# Patient Record
Sex: Female | Born: 1945 | Race: White | Hispanic: No | Marital: Married | Smoking: Current every day smoker
Health system: Southern US, Community
[De-identification: ages and names within clinical notes are randomized; demographics above are authoritative.]

## PROBLEM LIST (undated history)

## (undated) DIAGNOSIS — Z1371 Encounter for nonprocreative screening for genetic disease carrier status: Secondary | ICD-10-CM

## (undated) DIAGNOSIS — C801 Malignant (primary) neoplasm, unspecified: Secondary | ICD-10-CM

## (undated) DIAGNOSIS — E785 Hyperlipidemia, unspecified: Secondary | ICD-10-CM

## (undated) DIAGNOSIS — M810 Age-related osteoporosis without current pathological fracture: Secondary | ICD-10-CM

## (undated) DIAGNOSIS — Z1211 Encounter for screening for malignant neoplasm of colon: Secondary | ICD-10-CM

## (undated) DIAGNOSIS — H209 Unspecified iridocyclitis: Secondary | ICD-10-CM

## (undated) DIAGNOSIS — R911 Solitary pulmonary nodule: Secondary | ICD-10-CM

## (undated) DIAGNOSIS — H3320 Serous retinal detachment, unspecified eye: Secondary | ICD-10-CM

## (undated) DIAGNOSIS — J8410 Pulmonary fibrosis, unspecified: Secondary | ICD-10-CM

## (undated) DIAGNOSIS — Z860101 Personal history of adenomatous and serrated colon polyps: Secondary | ICD-10-CM

## (undated) DIAGNOSIS — I77810 Thoracic aortic ectasia: Secondary | ICD-10-CM

## (undated) DIAGNOSIS — Z853 Personal history of malignant neoplasm of breast: Secondary | ICD-10-CM

## (undated) DIAGNOSIS — Z87891 Personal history of nicotine dependence: Secondary | ICD-10-CM

## (undated) DIAGNOSIS — Z720 Tobacco use: Secondary | ICD-10-CM

## (undated) DIAGNOSIS — T7840XA Allergy, unspecified, initial encounter: Secondary | ICD-10-CM

## (undated) HISTORY — DX: Tobacco use: Z72.0

## (undated) HISTORY — DX: Personal history of malignant neoplasm of breast: Z85.3

## (undated) HISTORY — DX: Allergy, unspecified, initial encounter: T78.40XA

## (undated) HISTORY — DX: Unspecified iridocyclitis: H20.9

## (undated) HISTORY — DX: Encounter for screening for malignant neoplasm of colon: Z12.11

## (undated) HISTORY — DX: Personal history of nicotine dependence: Z87.891

## (undated) HISTORY — PX: SKIN CANCER EXCISION: SHX779

## (undated) HISTORY — DX: Serous retinal detachment, unspecified eye: H33.20

## (undated) HISTORY — DX: Encounter for nonprocreative screening for genetic disease carrier status: Z13.71

## (undated) HISTORY — PX: BREAST SURGERY: SHX581

## (undated) HISTORY — PX: CATARACT EXTRACTION W/ INTRAOCULAR LENS IMPLANT: SHX1309

## (undated) HISTORY — PX: EYE SURGERY: SHX253

## (undated) HISTORY — DX: Malignant (primary) neoplasm, unspecified: C80.1

---
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
## 1985-03-30 DIAGNOSIS — Z853 Personal history of malignant neoplasm of breast: Secondary | ICD-10-CM

## 1985-03-30 DIAGNOSIS — C801 Malignant (primary) neoplasm, unspecified: Secondary | ICD-10-CM

## 1985-03-30 HISTORY — DX: Personal history of malignant neoplasm of breast: Z85.3

## 1985-03-30 HISTORY — PX: MASTECTOMY: SHX3

## 1985-03-30 HISTORY — DX: Malignant (primary) neoplasm, unspecified: C80.1

## 1998-04-19 ENCOUNTER — Other Ambulatory Visit: Admission: RE | Admit: 1998-04-19 | Discharge: 1998-04-19 | Payer: Self-pay | Admitting: *Deleted

## 1999-06-06 ENCOUNTER — Other Ambulatory Visit: Admission: RE | Admit: 1999-06-06 | Discharge: 1999-06-06 | Payer: Self-pay | Admitting: *Deleted

## 2000-06-10 ENCOUNTER — Other Ambulatory Visit: Admission: RE | Admit: 2000-06-10 | Discharge: 2000-06-10 | Payer: Self-pay | Admitting: *Deleted

## 2001-06-10 ENCOUNTER — Other Ambulatory Visit: Admission: RE | Admit: 2001-06-10 | Discharge: 2001-06-10 | Payer: Self-pay | Admitting: *Deleted

## 2002-06-15 ENCOUNTER — Other Ambulatory Visit: Admission: RE | Admit: 2002-06-15 | Discharge: 2002-06-15 | Payer: Self-pay | Admitting: *Deleted

## 2003-03-31 DIAGNOSIS — H209 Unspecified iridocyclitis: Secondary | ICD-10-CM

## 2003-03-31 HISTORY — DX: Unspecified iridocyclitis: H20.9

## 2003-07-17 ENCOUNTER — Other Ambulatory Visit: Admission: RE | Admit: 2003-07-17 | Discharge: 2003-07-17 | Payer: Self-pay | Admitting: *Deleted

## 2004-07-03 ENCOUNTER — Ambulatory Visit: Payer: Self-pay | Admitting: General Surgery

## 2005-06-26 ENCOUNTER — Ambulatory Visit: Payer: Self-pay | Admitting: Unknown Physician Specialty

## 2005-08-17 ENCOUNTER — Ambulatory Visit: Payer: Self-pay | Admitting: General Surgery

## 2006-05-03 ENCOUNTER — Ambulatory Visit: Payer: Self-pay | Admitting: Ophthalmology

## 2006-08-18 ENCOUNTER — Ambulatory Visit: Payer: Self-pay | Admitting: General Surgery

## 2007-08-30 ENCOUNTER — Ambulatory Visit: Payer: Self-pay | Admitting: General Surgery

## 2008-03-30 HISTORY — PX: COLONOSCOPY: SHX174

## 2008-05-03 ENCOUNTER — Ambulatory Visit: Payer: Self-pay | Admitting: Internal Medicine

## 2008-09-06 ENCOUNTER — Ambulatory Visit: Payer: Self-pay | Admitting: General Surgery

## 2009-09-12 ENCOUNTER — Ambulatory Visit: Payer: Self-pay | Admitting: General Surgery

## 2009-12-04 ENCOUNTER — Ambulatory Visit: Payer: Self-pay | Admitting: Unknown Physician Specialty

## 2010-09-24 ENCOUNTER — Ambulatory Visit: Payer: Self-pay | Admitting: General Surgery

## 2011-10-07 ENCOUNTER — Ambulatory Visit: Payer: Self-pay | Admitting: General Surgery

## 2012-08-09 ENCOUNTER — Encounter: Payer: Self-pay | Admitting: *Deleted

## 2012-08-09 DIAGNOSIS — Z853 Personal history of malignant neoplasm of breast: Secondary | ICD-10-CM | POA: Insufficient documentation

## 2012-10-10 ENCOUNTER — Ambulatory Visit: Payer: Self-pay | Admitting: General Surgery

## 2012-10-12 ENCOUNTER — Encounter: Payer: Self-pay | Admitting: General Surgery

## 2012-10-25 ENCOUNTER — Encounter: Payer: Self-pay | Admitting: *Deleted

## 2012-10-27 ENCOUNTER — Ambulatory Visit (INDEPENDENT_AMBULATORY_CARE_PROVIDER_SITE_OTHER): Payer: Medicare Other | Admitting: General Surgery

## 2012-10-27 ENCOUNTER — Encounter: Payer: Self-pay | Admitting: General Surgery

## 2012-10-27 VITALS — BP 112/70 | HR 80 | Resp 12 | Ht 67.0 in | Wt 136.0 lb

## 2012-10-27 DIAGNOSIS — Z853 Personal history of malignant neoplasm of breast: Secondary | ICD-10-CM

## 2012-10-27 NOTE — Patient Instructions (Addendum)
Patient to return in one year left breat mammogram.

## 2012-10-27 NOTE — Progress Notes (Signed)
Patient ID: Marilyn Coleman, female   DOB: December 08, 1945, 67 y.o.   MRN: [id number]  Chief Complaint  Patient presents with  . Follow-up    mammogram    HPI Marilyn Coleman is a 67 y.o. female who presents for a breast evaluation. The most recent mammogram was done on 10/10/12 cat 2.  Patient does perform regular self breast checks and gets regular mammograms done.    HPI  Past Medical History  Diagnosis Date  . Allergy   . Personal history of tobacco use, presenting hazards to health   . Personal history of malignant neoplasm of breast 1987    right mastectomy  . Iritis 2005  . Special screening for malignant neoplasms, colon   . 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 Cancer 1987    right breast. mastectomy    Past Surgical History  Procedure Laterality Date  . Colonoscopy  2010    Dr. Mechele Collin  . Skin cancer excision  [id number]    face x 4  . Mastectomy Right 1987  . Tonsillectomy  1962  . Breast surgery Right     mastectomy    Family History  Problem Relation Age of Onset  . Cancer Other     breast cancer FH    Social History History  Substance Use Topics  . Smoking status: Current Every Day Smoker -- 2.00 packs/day for 35 years    Types: Cigarettes  . Smokeless tobacco: Never Used  . Alcohol Use: Yes     Comment: ocassionally    Allergies  Allergen Reactions  . Ace Inhibitors Other (See Comments)    Reaction not listed by pt.  . Acrylic Polymer (Carbomer) Itching    Allergy is to acrylic acid  . Eggs Or Egg-Derived Products Other (See Comments)    Joint pain    Current Outpatient Prescriptions  Medication Sig Dispense Refill  . ASTRAGALUS PO Take by mouth.      . loratadine (CLARITIN) 10 MG tablet Take 10 mg by mouth daily.       No current facility-administered medications for this visit.    Review of Systems Review of Systems  Constitutional: Negative.   Respiratory: Negative.   Cardiovascular: Negative.     Blood pressure 112/70, pulse 80, resp. rate 12,  height 5\' 7"  (1.702 m), weight 136 lb (61.689 kg).  Physical Exam Physical Exam  Constitutional: She is oriented to person, place, and time. She appears well-developed and well-nourished.  Eyes: Pupils are equal, round, and reactive to light. No scleral icterus.  Neck: Neck supple. No mass and no thyromegaly present.  Cardiovascular: Normal rate, regular rhythm and normal heart sounds.   Pulses:      Dorsalis pedis pulses are 2+ on the right side, and 2+ on the left side.       Posterior tibial pulses are 2+ on the right side, and 2+ on the left side.  No edema.  Pulmonary/Chest: Breath sounds normal. Left breast exhibits no inverted nipple, no mass, no nipple discharge, no skin change and no tenderness.  Right mastectomy well healed.No sign of local recurrence.  Abdominal: Soft. Bowel sounds are normal. There is no hepatosplenomegaly. There is no tenderness. No hernia.  Lymphadenopathy:    She has no cervical adenopathy.    She has no axillary adenopathy.  Neurological: She is alert and oriented to person, place, and time.  Skin: Skin is warm and dry.    Data Reviewed Mammogram reviewed  Assessment    Stable exam     Plan    Patient to return for an left breast mammogram in one year.       SANKAR,SEEPLAPUTHUR G 10/28/2012, 7:02 AM

## 2012-10-28 ENCOUNTER — Encounter: Payer: Self-pay | Admitting: General Surgery

## 2013-11-02 ENCOUNTER — Encounter: Payer: Self-pay | Admitting: General Surgery

## 2013-11-02 ENCOUNTER — Ambulatory Visit: Payer: Medicare Other | Admitting: General Surgery

## 2013-11-09 ENCOUNTER — Ambulatory Visit (INDEPENDENT_AMBULATORY_CARE_PROVIDER_SITE_OTHER): Payer: Self-pay | Admitting: General Surgery

## 2013-11-09 ENCOUNTER — Encounter: Payer: Self-pay | Admitting: General Surgery

## 2013-11-09 VITALS — BP 110/70 | HR 80 | Resp 14 | Ht 67.0 in | Wt 136.0 lb

## 2013-11-09 DIAGNOSIS — Z853 Personal history of malignant neoplasm of breast: Secondary | ICD-10-CM

## 2013-11-09 NOTE — Progress Notes (Signed)
Patient ID: Marilyn Coleman, female   DOB: 1945/05/29, 68 y.o.   MRN: [id number]  Chief Complaint  Patient presents with  . Follow-up    mammogram    HPI Marilyn Coleman is a 68 y.o. female.  who presents for her follow up breast evaluation. The most recent mammogram was done on 10-31-13 . Patient does perform regular self breast checks and gets regular mammograms done. No new breast complaints. No new health issues  HPI  Past Medical History  Diagnosis Date  . Allergy   . Personal history of tobacco use, presenting hazards to health   . Personal history of malignant neoplasm of breast 1987    right mastectomy  . Iritis 2005  . Special screening for malignant neoplasms, colon   . 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 Cancer 1987    right breast. mastectomy    Past Surgical History  Procedure Laterality Date  . Colonoscopy  2010    Dr. Vira Agar  . Skin cancer excision  [id number]    face x 4  . Mastectomy Right 1987  . Tonsillectomy  1962  . Breast surgery Right     mastectomy    Family History  Problem Relation Age of Onset  . Cancer Other     breast cancer FH    Social History History  Substance Use Topics  . Smoking status: Current Every Day Smoker -- 2.00 packs/day for 35 years    Types: Cigarettes  . Smokeless tobacco: Never Used  . Alcohol Use: Yes     Comment: ocassionally    Allergies  Allergen Reactions  . Ace Inhibitors Other (See Comments)    Reaction not listed by pt.  . Acrylic Polymer [Carbomer] Itching    Allergy is to acrylic acid  . Eggs Or Egg-Derived Products Other (See Comments)    Joint pain    Current Outpatient Prescriptions  Medication Sig Dispense Refill  . ASTRAGALUS PO Take by mouth.      . loratadine (CLARITIN) 10 MG tablet Take 10 mg by mouth daily.       No current facility-administered medications for this visit.    Review of Systems Review of Systems  Constitutional: Negative.   Respiratory: Negative.   Cardiovascular: Negative.      Blood pressure 110/70, pulse 80, resp. rate 14, height 5\' 7"  (1.702 m), weight 136 lb (61.689 kg).  Physical Exam Physical Exam  Constitutional: She is oriented to person, place, and time. She appears well-developed and well-nourished.  Eyes: Conjunctivae are normal. No scleral icterus.  Neck: Neck supple. No thyromegaly present.  Cardiovascular: Normal rate, regular rhythm and normal heart sounds.   No murmur heard. Pulmonary/Chest: Effort normal and breath sounds normal. Left breast exhibits no inverted nipple, no mass, no nipple discharge, no skin change and no tenderness.  Right mastectomy site well healed, clean, no signs of reoccurrence.   Abdominal: Soft. Normal appearance and bowel sounds are normal. There is no hepatomegaly. There is no tenderness.  Lymphadenopathy:    She has no cervical adenopathy.    She has no axillary adenopathy.  Neurological: She is alert and oriented to person, place, and time.  Skin: Skin is warm and dry.    Data Reviewed Left mammogram-stable.  Assessment    History of right breast cancer, MRM in 1987. Doing well.     Plan    1 yr f/u        [facility] G 11/09/2013, 6:59  PM

## 2013-11-09 NOTE — Patient Instructions (Signed)
Patient to return in 1 year with left screening mammogram. Continue self breast exams. Call office for any new breast issues or concerns.

## 2014-01-29 ENCOUNTER — Encounter: Payer: Self-pay | Admitting: General Surgery

## 2014-04-24 DIAGNOSIS — M810 Age-related osteoporosis without current pathological fracture: Secondary | ICD-10-CM | POA: Insufficient documentation

## 2014-04-24 DIAGNOSIS — E785 Hyperlipidemia, unspecified: Secondary | ICD-10-CM | POA: Insufficient documentation

## 2014-04-24 DIAGNOSIS — H209 Unspecified iridocyclitis: Secondary | ICD-10-CM | POA: Insufficient documentation

## 2014-08-08 ENCOUNTER — Other Ambulatory Visit: Payer: Self-pay | Admitting: Family Medicine

## 2014-08-08 ENCOUNTER — Encounter: Payer: Self-pay | Admitting: Family Medicine

## 2014-08-08 DIAGNOSIS — Z720 Tobacco use: Secondary | ICD-10-CM | POA: Insufficient documentation

## 2014-08-08 HISTORY — DX: Tobacco use: Z72.0

## 2014-08-10 ENCOUNTER — Inpatient Hospital Stay: Payer: Medicare Other | Attending: Family Medicine | Admitting: Family Medicine

## 2014-08-10 ENCOUNTER — Ambulatory Visit
Admission: RE | Admit: 2014-08-10 | Discharge: 2014-08-10 | Disposition: A | Discharge status: Home / Self Care | Payer: Medicare Other | Source: Ambulatory Visit | Attending: Family Medicine | Admitting: Family Medicine

## 2014-08-10 ENCOUNTER — Encounter: Payer: Self-pay | Admitting: Family Medicine

## 2014-08-10 ENCOUNTER — Encounter (INDEPENDENT_AMBULATORY_CARE_PROVIDER_SITE_OTHER): Payer: Self-pay

## 2014-08-10 DIAGNOSIS — Z122 Encounter for screening for malignant neoplasm of respiratory organs: Secondary | ICD-10-CM

## 2014-08-10 DIAGNOSIS — F17200 Nicotine dependence, unspecified, uncomplicated: Secondary | ICD-10-CM | POA: Diagnosis present

## 2014-08-10 DIAGNOSIS — F17210 Nicotine dependence, cigarettes, uncomplicated: Secondary | ICD-10-CM

## 2014-08-10 DIAGNOSIS — I700 Atherosclerosis of aorta: Secondary | ICD-10-CM | POA: Insufficient documentation

## 2014-08-10 DIAGNOSIS — Z720 Tobacco use: Secondary | ICD-10-CM

## 2014-08-10 NOTE — Progress Notes (Signed)
In accordance with CMS guidelines, patient has meet eligibility criteria including age, absence of signs or symptoms of lung cancer, the specific calculation of cigarette smoking pack-years was 60 years and is a current smoker.   A shared decision-making session was conducted prior to the performance of CT scan. This includes one or more decision aids, includes benefits and harms of screening, follow-up diagnostic testing, over-diagnosis, false positive rate, and total radiation exposure.  Counseling on the importance of adherence to annual lung cancer LDCT screening, impact of co-morbidities, and ability or willingness to undergo diagnosis and treatment is imperative for compliance of the program.  Counseling on the importance of continued smoking cessation for former smokers; the importance of smoking cessation for current smokers and information about tobacco cessation interventions have been given to patient including the [place] at [facility], 1800 quit [place], as well as Cancer Center specific smoking cessation programs.  Written order for lung cancer screening with LDCT has been given to the patient and any and all questions have been answered to the best of my abilities.   Yearly follow up will be scheduled by Shawn Perkins, Thoracic Navigator.   

## 2014-08-13 ENCOUNTER — Telehealth: Payer: Self-pay | Admitting: *Deleted

## 2014-08-13 NOTE — Telephone Encounter (Signed)
Notified patient of LDCT lung cancer screening results of [facility] [place] 1. Finding with recommendation for 12 month follow up imaging. Also notified of incidental finding of evidence of calcified aortic atherosclerotic changes and prior granulomatous infection. Patient is encouraged to discuss further with her primary care provider and a message will be sent to Dr. Caryl Comes.

## 2014-09-26 ENCOUNTER — Other Ambulatory Visit: Payer: Self-pay

## 2014-09-26 DIAGNOSIS — Z853 Personal history of malignant neoplasm of breast: Secondary | ICD-10-CM

## 2014-10-29 HISTORY — PX: RETINAL DETACHMENT SURGERY: SHX105

## 2014-11-14 ENCOUNTER — Ambulatory Visit: Payer: Medicare Other | Admitting: General Surgery

## 2014-12-11 ENCOUNTER — Ambulatory Visit (INDEPENDENT_AMBULATORY_CARE_PROVIDER_SITE_OTHER): Payer: Medicare Other | Admitting: General Surgery

## 2014-12-11 ENCOUNTER — Encounter: Payer: Self-pay | Admitting: General Surgery

## 2014-12-11 VITALS — BP 116/68 | HR 72 | Resp 14 | Ht 66.0 in | Wt 139.0 lb

## 2014-12-11 DIAGNOSIS — Z853 Personal history of malignant neoplasm of breast: Secondary | ICD-10-CM

## 2014-12-11 NOTE — Patient Instructions (Signed)
Call if any concerns or questions.

## 2014-12-11 NOTE — Progress Notes (Signed)
Patient ID: Marilyn Coleman, female   DOB: Mar 06, 1946, 69 y.o.   MRN: [id number]  Chief Complaint  Patient presents with  . Follow-up    Mammogram     HPI Marilyn Coleman is a 69 y.o. female who presents for a breast evaluation. The most recent mammogram was done on 12/04/14.  Patient does perform regular self breast checks and gets regular mammograms done. Patient denies any breast problems to her left breast. Patient had retinal detachment surgery done 10/2014.    HPI  Past Medical History  Diagnosis Date  . Allergy   . Personal history of tobacco use, presenting hazards to health   . Personal history of malignant neoplasm of breast 1987    right mastectomy  . Iritis 2005  . Special screening for malignant neoplasms, colon   . 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 Cancer 1987    right breast. mastectomy  . Tobacco abuse 08/08/2014    Past Surgical History  Procedure Laterality Date  . Colonoscopy  2010    Dr. Vira Agar  . Skin cancer excision  [id number]    face x 4  . Mastectomy Right 1987  . Tonsillectomy  1962  . Breast surgery Right     mastectomy  . Retinal detachment surgery  10/2014    Family History  Problem Relation Age of Onset  . Cancer Other     breast cancer FH    Social History Social History  Substance Use Topics  . Smoking status: Current Every Day Smoker -- 2.00 packs/day for 30 years    Types: Cigarettes  . Smokeless tobacco: Never Used  . Alcohol Use: 0.0 oz/week    0 Standard drinks or equivalent per week     Comment: ocassionally    Allergies  Allergen Reactions  . Acrylic Polymer [Carbomer] Itching    Allergy is to acrylic acid  . Eggs Or Egg-Derived Products Other (See Comments)    Joint pain    Current Outpatient Prescriptions  Medication Sig Dispense Refill  . Alpha-Lipoic Acid 50 MG CAPS     . ASTRAGALUS PO Take by mouth.    . Calcium Citrate 250 MG TABS     . CHROMIUM PO     . Co-Enzyme Q-10 30 MG CAPS     . loratadine (CLARITIN) 10 MG  tablet Take 10 mg by mouth.    . Multiple Vitamin (MULTI-VITAMINS) TABS Take by mouth.    . selenium 50 MCG TABS tablet      No current facility-administered medications for this visit.    Review of Systems Review of Systems  Constitutional: Negative.   Respiratory: Negative.   Cardiovascular: Negative.     Blood pressure 116/68, pulse 72, resp. rate 14, height 5\' 6"  (1.676 m), weight 139 lb (63.05 kg).  Physical Exam Physical Exam  Constitutional: She is oriented to person, place, and time. She appears well-developed and well-nourished.  HENT:  Mouth/Throat: Oropharynx is clear and moist.  Eyes: Conjunctivae are normal. No scleral icterus.  Neck: Neck supple.  Cardiovascular: Normal rate, regular rhythm and normal heart sounds.   Pulmonary/Chest: Effort normal and breath sounds normal. Left breast exhibits no inverted nipple, no mass, no nipple discharge, no skin change and no tenderness.  Right mastectomy site remains well healed. No evidence of local recurrence  Lymphadenopathy:    She has no cervical adenopathy.    She has no axillary adenopathy.  Neurological: She is alert and oriented to  person, place, and time.  Skin: Skin is warm and dry.  Psychiatric: Her behavior is normal.    Data Reviewed Mammogram reviewed   Assessment Stable exam. History of right breast CA-1987   Plan    Return in one year with left breast mammogram.      PCP: Luther Redo G 12/12/2014, 11:04 AM

## 2014-12-12 ENCOUNTER — Encounter: Payer: Self-pay | Admitting: General Surgery

## 2015-02-26 ENCOUNTER — Encounter: Payer: Self-pay | Admitting: Obstetrics and Gynecology

## 2015-02-26 ENCOUNTER — Ambulatory Visit (INDEPENDENT_AMBULATORY_CARE_PROVIDER_SITE_OTHER): Payer: Medicare Other | Admitting: Obstetrics and Gynecology

## 2015-02-26 VITALS — BP 124/79 | HR 118 | Ht 67.0 in | Wt 141.9 lb

## 2015-02-26 DIAGNOSIS — C50919 Malignant neoplasm of unspecified site of unspecified female breast: Secondary | ICD-10-CM | POA: Insufficient documentation

## 2015-02-26 DIAGNOSIS — M810 Age-related osteoporosis without current pathological fracture: Secondary | ICD-10-CM | POA: Diagnosis not present

## 2015-02-26 DIAGNOSIS — J309 Allergic rhinitis, unspecified: Secondary | ICD-10-CM | POA: Insufficient documentation

## 2015-02-26 DIAGNOSIS — N952 Postmenopausal atrophic vaginitis: Secondary | ICD-10-CM | POA: Diagnosis not present

## 2015-02-26 DIAGNOSIS — Z01419 Encounter for gynecological examination (general) (routine) without abnormal findings: Secondary | ICD-10-CM | POA: Diagnosis not present

## 2015-02-26 DIAGNOSIS — Z9189 Other specified personal risk factors, not elsewhere classified: Secondary | ICD-10-CM

## 2015-02-26 NOTE — Progress Notes (Signed)
Patient ID: Marilyn Coleman, female   DOB: [birth year], 69 y.o.   MRN: [id number] ANNUAL PREVENTATIVE CARE GYN  ENCOUNTER NOTE  Subjective:       Marilyn Coleman is a 69 y.o. G2P2 female here for a routine annual gynecologic exam.  Current complaints: 1.  Medicare breast and pelvic  2.  History of breast cancer, followed by Dr. Jamal Collin 3.  History of osteoporosis. 4.  Vaginal atrophy   Gynecologic History No LMP recorded. Patient is postmenopausal. Contraception: post menopausal status Last Pap: 2015. Results were: normal Last mammogram: 07/2014. Results were: normal History of osteoporosis on DEXA scan in 2012  Obstetric History OB History  Gravida Para Term Preterm AB SAB TAB Ectopic Multiple Living  2 2        2     # Outcome Date GA Lbr Len/2nd Weight Sex Delivery Anes PTL Lv  2 Para           1 Para             Obstetric Comments  Age with first menstruation-11  Age with first pregnancy-24  LMP-1988    Past Medical History  Diagnosis Date  . Allergy   . Personal history of tobacco use, presenting hazards to health   . Personal history of malignant neoplasm of breast 1987    right mastectomy  . Iritis 2005  . Special screening for malignant neoplasms, colon   . Cancer ([place]) 2012    face x 4  . Cancer [facility]) 1987    right breast. mastectomy  . Tobacco abuse 08/08/2014  . Detached retina     Past Surgical History  Procedure Laterality Date  . Colonoscopy  2010    Dr. Vira Agar  . Skin cancer excision  [id number]    face x 4  . Mastectomy Right 1987  . Tonsillectomy  1962  . Breast surgery Right     mastectomy  . Retinal detachment surgery  10/2014    Current Outpatient Prescriptions on File Prior to Visit  Medication Sig Dispense Refill  . Alpha-Lipoic Acid 50 MG CAPS     . ASTRAGALUS PO Take by mouth.    . Calcium Citrate 250 MG TABS     . CHROMIUM PO     . Co-Enzyme Q-10 30 MG CAPS     . loratadine (CLARITIN) 10 MG tablet Take 10 mg by mouth.    . Multiple  Vitamin (MULTI-VITAMINS) TABS Take by mouth.    . selenium 50 MCG TABS tablet      No current facility-administered medications on file prior to visit.    Allergies  Allergen Reactions  . Acrylic Polymer [Carbomer] Itching    Allergy is to acrylic acid  . Eggs Or Egg-Derived Products Other (See Comments)    Joint pain    Social History   Social History  . Marital Status: Married    Spouse Name: N/A  . Number of Children: N/A  . Years of Education: N/A   Occupational History  . Not on file.   Social History Main Topics  . Smoking status: Current Every Day Smoker -- 2.00 packs/day for 30 years    Types: Cigarettes  . Smokeless tobacco: Never Used  . Alcohol Use: 0.0 oz/week    0 Standard drinks or equivalent per week     Comment: ocassionally  . Drug Use: No  . Sexual Activity: Yes    Birth Control/ Protection: Post-menopausal   Other Topics Concern  .  Not on file   Social History Narrative    Family History  Problem Relation Age of Onset  . Cancer Other     breast cancer FH  . Breast cancer Mother   . Breast cancer Paternal Grandmother   . Diabetes Neg Hx   . Heart disease Neg Hx   . Colon cancer Neg Hx   . Ovarian cancer Neg Hx     The following portions of the patient's history were reviewed and updated as appropriate: allergies, current medications, past family history, past medical history, past social history, past surgical history and problem list.  Review of Systems ROS Review of Systems - General ROS: negative for - chills, fatigue, fever, hot flashes, night sweats, weight gain or weight loss Psychological ROS: negative for - anxiety, decreased libido, depression, mood swings, physical abuse or sexual abuse Ophthalmic ROS: negative for - blurry vision, eye pain or loss of vision ENT ROS: negative for - headaches, hearing change, visual changes or vocal changes Allergy and Immunology ROS: negative for - hives, itchy/watery eyes or seasonal  allergies Hematological and Lymphatic ROS: negative for - bleeding problems, bruising, swollen lymph nodes or weight loss Endocrine ROS: negative for - galactorrhea, hair pattern changes, hot flashes, malaise/lethargy, mood swings, palpitations, polydipsia/polyuria, skin changes, temperature intolerance or unexpected weight changes Breast ROS: negative for - new or changing breast lumps or nipple discharge Respiratory ROS: negative for - cough or shortness of breath Cardiovascular ROS: negative for - chest pain, irregular heartbeat, palpitations or shortness of breath Gastrointestinal ROS: no abdominal pain, change in bowel habits, or black or bloody stools Genito-Urinary ROS: no dysuria, trouble voiding, or hematuria Musculoskeletal ROS: negative for - joint pain or joint stiffness Neurological ROS: negative for - bowel and bladder control changes Dermatological ROS: negative for rash and skin lesion changes   Objective:   BP 124/79 mmHg  Pulse 118  Ht 5\' 7"  (1.702 m)  Wt 141 lb 14.4 oz (64.365 kg)  BMI 22.22 kg/m2 CONSTITUTIONAL: Well-developed, well-nourished female in no acute distress.  PSYCHIATRIC: Normal mood and affect. Normal behavior. Normal judgment and thought content. [place]: Alert and oriented to person, place, and time. Normal muscle tone coordination. No cranial nerve deficit noted. HENT:  Normocephalic, atraumatic, External right and left ear normal. Oropharynx is clear and moist EYES: Conjunctivae and EOM are normal. Pupils are equal, round, and reactive to light. No scleral icterus.  NECK: Normal range of motion, supple, no masses.  Normal thyroid.  SKIN: Skin is warm and dry. No rash noted. Not diaphoretic. No erythema. No pallor. CARDIOVASCULAR: Normal heart rate noted, regular rhythm, no murmur. RESPIRATORY: Clear to auscultation bilaterally. Effort and breath sounds normal, no problems with respiration noted. BREASTS: Right mastectomy scar, healed; no tenderness  or mass palpated left breast without mass, skin changes, nipple drainage, or lymphadenopathy. ABDOMEN: Soft, normal bowel sounds, no distention noted.  No tenderness, rebound or guarding.  BLADDER: Normal PELVIC:  External Genitalia: Normal  BUS: Normal  Vagina: Normal  Cervix: Normal  Uterus: Normal; Midplane, nontender  Adnexa: Normal; Nonpalpable and nontender  RV: External Exam NormaI, No Rectal Masses and Normal Sphincter tone  MUSCULOSKELETAL: Normal range of motion. No tenderness.  No cyanosis, clubbing, or edema.  2+ distal pulses. LYMPHATIC: No Axillary, Supraclavicular, or Inguinal Adenopathy.    Assessment:   Annual gynecologic examination 69 y.o. Contraception: post menopausal status Normal BMI History of breast cancer, no evidence of disease. Osteoporosis. Vaginal atrophy  Plan:  Pap: Not needed  Mammogram: thru dr Jamal Collin Stool Guaiac Testing:  Declines- pt will have colonoscopy soon Labs: thru pcp Routine preventative health maintenance measures emphasized: Exercise/Diet/Weight control, Tobacco Warnings and Alcohol/Substance use risks Continue calcium with vitamin D supplementation Continue with Estrace cream 1 g intravaginal once or twice a week Return to [place], CMA Brayton Mars, MD  Note: This dictation was prepared with Dragon dictation along with smaller phrase technology. Any transcriptional errors that result from this process are unintentional.

## 2015-04-09 ENCOUNTER — Encounter: Payer: Self-pay | Admitting: *Deleted

## 2015-04-15 ENCOUNTER — Encounter
Admission: RE | Disposition: A | Discharge status: Home / Self Care | Payer: Self-pay | Source: Ambulatory Visit | Attending: Ophthalmology

## 2015-04-15 ENCOUNTER — Ambulatory Visit: Payer: Medicare Other | Admitting: *Deleted

## 2015-04-15 ENCOUNTER — Encounter: Payer: Self-pay | Admitting: *Deleted

## 2015-04-15 ENCOUNTER — Ambulatory Visit
Admission: RE | Admit: 2015-04-15 | Discharge: 2015-04-15 | Disposition: A | Discharge status: Home / Self Care | Payer: Medicare Other | Source: Ambulatory Visit | Attending: Ophthalmology | Admitting: Ophthalmology

## 2015-04-15 DIAGNOSIS — Z9011 Acquired absence of right breast and nipple: Secondary | ICD-10-CM | POA: Diagnosis not present

## 2015-04-15 DIAGNOSIS — Z9842 Cataract extraction status, left eye: Secondary | ICD-10-CM | POA: Diagnosis not present

## 2015-04-15 DIAGNOSIS — Z853 Personal history of malignant neoplasm of breast: Secondary | ICD-10-CM | POA: Diagnosis not present

## 2015-04-15 DIAGNOSIS — H2511 Age-related nuclear cataract, right eye: Secondary | ICD-10-CM | POA: Diagnosis present

## 2015-04-15 DIAGNOSIS — M810 Age-related osteoporosis without current pathological fracture: Secondary | ICD-10-CM | POA: Insufficient documentation

## 2015-04-15 DIAGNOSIS — Z85828 Personal history of other malignant neoplasm of skin: Secondary | ICD-10-CM | POA: Diagnosis not present

## 2015-04-15 DIAGNOSIS — H25011 Cortical age-related cataract, right eye: Secondary | ICD-10-CM | POA: Insufficient documentation

## 2015-04-15 DIAGNOSIS — F17200 Nicotine dependence, unspecified, uncomplicated: Secondary | ICD-10-CM | POA: Insufficient documentation

## 2015-04-15 DIAGNOSIS — H25041 Posterior subcapsular polar age-related cataract, right eye: Secondary | ICD-10-CM | POA: Diagnosis not present

## 2015-04-15 HISTORY — PX: CATARACT EXTRACTION W/PHACO: SHX586

## 2015-04-15 SURGERY — PHACOEMULSIFICATION, CATARACT, WITH IOL INSERTION
Anesthesia: Monitor Anesthesia Care | Site: Eye | Laterality: Right | Wound class: Clean

## 2015-04-15 MED ORDER — MOXIFLOXACIN HCL 0.5 % OP SOLN
1.0000 [drp] | OPHTHALMIC | Status: DC | PRN
  Administered 2015-04-15 (×2): 1 [drp] via OPHTHALMIC

## 2015-04-15 MED ORDER — TETRACAINE HCL 0.5 % OP SOLN
OPHTHALMIC | Status: AC
  Filled 2015-04-15: qty 2

## 2015-04-15 MED ORDER — LIDOCAINE HCL (PF) 4 % IJ SOLN
INTRAMUSCULAR | Status: AC
Start: 2015-04-15 — End: 2015-04-15
  Filled 2015-04-15: qty 10

## 2015-04-15 MED ORDER — TETRACAINE HCL 0.5 % OP SOLN
OPHTHALMIC | Status: DC | PRN
  Administered 2015-04-15: 1 [drp] via OPHTHALMIC

## 2015-04-15 MED ORDER — CYCLOPENTOLATE HCL 2 % OP SOLN
OPHTHALMIC | Status: AC
  Administered 2015-04-15: 1 [drp] via OPHTHALMIC
  Filled 2015-04-15: qty 2

## 2015-04-15 MED ORDER — CYCLOPENTOLATE HCL 2 % OP SOLN
1.0000 [drp] | OPHTHALMIC | Status: AC | PRN
  Administered 2015-04-15 (×3): 1 [drp] via OPHTHALMIC

## 2015-04-15 MED ORDER — ALFENTANIL 500 MCG/ML IJ INJ
INJECTION | INTRAMUSCULAR | Status: DC | PRN
  Administered 2015-04-15: 600 ug via INTRAVENOUS

## 2015-04-15 MED ORDER — CEFUROXIME OPHTHALMIC INJECTION 1 MG/0.1 ML
INJECTION | OPHTHALMIC | Status: AC
  Filled 2015-04-15: qty 0.1

## 2015-04-15 MED ORDER — HYALURONIDASE HUMAN 150 UNIT/ML IJ SOLN
INTRAMUSCULAR | Status: AC
  Filled 2015-04-15: qty 1

## 2015-04-15 MED ORDER — SODIUM CHLORIDE 0.9 % IV SOLN
INTRAVENOUS | Status: DC
  Administered 2015-04-15 (×2): via INTRAVENOUS

## 2015-04-15 MED ORDER — EPINEPHRINE HCL 1 MG/ML IJ SOLN
INTRAMUSCULAR | Status: AC
  Filled 2015-04-15: qty 2

## 2015-04-15 MED ORDER — PHENYLEPHRINE HCL 10 % OP SOLN
1.0000 [drp] | OPHTHALMIC | Status: DC | PRN
  Administered 2015-04-15 (×3): 1 [drp] via OPHTHALMIC

## 2015-04-15 MED ORDER — ONDANSETRON HCL 4 MG/2ML IJ SOLN
INTRAMUSCULAR | Status: DC | PRN
  Administered 2015-04-15: 4 mg via INTRAVENOUS

## 2015-04-15 MED ORDER — MIDAZOLAM HCL 2 MG/2ML IJ SOLN
INTRAMUSCULAR | Status: DC | PRN
  Administered 2015-04-15: 1 mg via INTRAVENOUS

## 2015-04-15 MED ORDER — LIDOCAINE HCL (PF) 4 % IJ SOLN
INTRAMUSCULAR | Status: DC | PRN
  Administered 2015-04-15: 4 mL via OPHTHALMIC

## 2015-04-15 MED ORDER — CEFUROXIME OPHTHALMIC INJECTION 1 MG/0.1 ML
INJECTION | OPHTHALMIC | Status: DC | PRN
  Administered 2015-04-15: .1 mL via INTRACAMERAL

## 2015-04-15 MED ORDER — NA CHONDROIT SULF-NA HYALURON 40-17 MG/ML IO SOLN
INTRAOCULAR | Status: DC | PRN
  Administered 2015-04-15: 1 mL via INTRAOCULAR

## 2015-04-15 MED ORDER — MOXIFLOXACIN HCL 0.5 % OP SOLN
OPHTHALMIC | Status: DC | PRN
  Administered 2015-04-15: 1 [drp] via OPHTHALMIC

## 2015-04-15 MED ORDER — NA CHONDROIT SULF-NA HYALURON 40-17 MG/ML IO SOLN
INTRAOCULAR | Status: AC
  Filled 2015-04-15: qty 1

## 2015-04-15 MED ORDER — BUPIVACAINE HCL (PF) 0.75 % IJ SOLN
INTRAMUSCULAR | Status: AC
  Filled 2015-04-15: qty 10

## 2015-04-15 MED ORDER — CARBACHOL 0.01 % IO SOLN
INTRAOCULAR | Status: DC | PRN
  Administered 2015-04-15: .5 mL via INTRAOCULAR

## 2015-04-15 MED ORDER — LIDOCAINE HCL (PF) 4 % IJ SOLN
INTRAOCULAR | Status: DC | PRN
  Administered 2015-04-15: .5 mL via OPHTHALMIC

## 2015-04-15 MED ORDER — EPINEPHRINE HCL 1 MG/ML IJ SOLN
INTRAOCULAR | Status: DC | PRN
  Administered 2015-04-15: 1 mL via OPHTHALMIC

## 2015-04-15 MED ORDER — MOXIFLOXACIN HCL 0.5 % OP SOLN
OPHTHALMIC | Status: AC
  Administered 2015-04-15: 1 [drp] via OPHTHALMIC
  Filled 2015-04-15: qty 3

## 2015-04-15 MED ORDER — PHENYLEPHRINE HCL 10 % OP SOLN
OPHTHALMIC | Status: AC
  Administered 2015-04-15: 1 [drp] via OPHTHALMIC
  Filled 2015-04-15: qty 5

## 2015-04-15 SURGICAL SUPPLY — 29 items

## 2015-04-15 NOTE — Interval H&P Note (Signed)
History and Physical Interval Note:  04/15/2015 11:27 AM  Marilyn Coleman  has presented today for surgery, with the diagnosis of CATARACT  The various methods of treatment have been discussed with the patient and family. After consideration of risks, benefits and other options for treatment, the patient has consented to  Procedure(s): CATARACT EXTRACTION PHACO AND INTRAOCULAR LENS PLACEMENT ([place]) (Right) as a surgical intervention .  The patient's history has been reviewed, patient examined, no change in status, stable for surgery.  I have reviewed the patient's chart and labs.  Questions were answered to the patient's satisfaction.     Zephyr Sausedo

## 2015-04-15 NOTE — Anesthesia Preprocedure Evaluation (Signed)
Anesthesia Evaluation  Patient identified by MRN, date of birth, ID band Patient awake    Reviewed: Allergy & Precautions, NPO status , Patient's Chart, lab work & pertinent test results  Airway Mallampati: I  TM Distance: >3 FB Neck ROM: Limited    Dental  (+) Teeth Intact   Pulmonary Current Smoker,  30 pack year history.   Pulmonary exam normal        Cardiovascular Exercise Tolerance: Good Normal cardiovascular exam     Neuro/Psych    GI/Hepatic   Endo/Other    Renal/GU      Musculoskeletal   Abdominal Normal abdominal exam  (+)   Peds  Hematology   Anesthesia Other Findings   Reproductive/Obstetrics                             Anesthesia Physical Anesthesia Plan  ASA: II  Anesthesia Plan: MAC   Post-op Pain Management:    Induction: Intravenous  Airway Management Planned: Nasal Cannula  Additional Equipment:   Intra-op Plan:   Post-operative Plan:   Informed Consent: I have reviewed the patients History and Physical, chart, labs and discussed the procedure including the risks, benefits and alternatives for the proposed anesthesia with the patient or authorized representative who has indicated his/her understanding and acceptance.     Plan Discussed with: CRNA  Anesthesia Plan Comments:         Anesthesia Quick Evaluation

## 2015-04-15 NOTE — Discharge Instructions (Signed)
AMBULATORY SURGERY  DISCHARGE INSTRUCTIONS   1) The drugs that you were given will stay in your system until tomorrow so for the next 24 hours you should not:  A) Drive an automobile B) Make any legal decisions C) Drink any alcoholic beverage   2) You may resume regular meals tomorrow.  Today it is better to start with liquids and gradually work up to solid foods.  You may eat anything you prefer, but it is better to start with liquids, then soup and crackers, and gradually work up to solid foods.   3) Please notify your doctor immediately if you have any unusual bleeding, trouble breathing, redness and pain at the surgery site, drainage, fever, or pain not relieved by medication.    4) Additional Instructions:        Please contact your physician with any problems or Same Day Surgery at [phone], Monday through Friday 6 am to 4 pm, or [place] at [facility] number at [phone]. Eye Surgery Discharge Instructions  Expect mild scratchy sensation or mild soreness. DO NOT RUB YOUR EYE!  The day of surgery:  Minimal physical activity, but bed rest is not required  No reading, computer work, or close hand work  No bending, lifting, or straining.  May watch TV  For 24 hours:  No driving, legal decisions, or alcoholic beverages  Safety precautions  Eat anything you prefer: It is better to start with liquids, then soup then solid foods.  _____ Eye patch should be worn until postoperative exam tomorrow.  ____ Solar shield eyeglasses should be worn for comfort in the sunlight/patch while sleeping  Resume all regular medications including aspirin or Coumadin if these were discontinued prior to surgery. You may shower, bathe, shave, or wash your hair. Tylenol may be taken for mild discomfort.  Call your doctor if you experience significant pain, nausea, or vomiting, fever > 101 or other signs of infection. [phone] or [phone] Specific  instructions:  Follow-up Information    Follow up with Wilsie Kern, MD In 1 day.   Specialty:  Ophthalmology   Why:  January 16 at 9:05am   Contact information:   [address]   [place] [address] [phone]

## 2015-04-15 NOTE — Anesthesia Postprocedure Evaluation (Signed)
Anesthesia Post Note  Patient: Marilyn Coleman  Procedure(s) Performed: Procedure(s) (LRB): CATARACT EXTRACTION PHACO AND INTRAOCULAR LENS PLACEMENT ([facility]) (Right)  Patient location during evaluation: PACU Anesthesia Type: MAC Level of consciousness: awake Respiratory status: spontaneous breathing Cardiovascular status: blood pressure returned to baseline Postop Assessment: no headache    Last Vitals:  Filed Vitals:   04/15/15 0958  BP: 136/83  Pulse: 86  Temp: 36.7 C  Resp: 16    Last Pain: There were no vitals filed for this visit.               Buckner Malta

## 2015-04-15 NOTE — H&P (Signed)
See scanned note.

## 2015-04-15 NOTE — Addendum Note (Signed)
Addendum  created 04/15/15 1424 by Allean Found, CRNA   Modules edited: Anesthesia Medication Administration

## 2015-04-15 NOTE — Op Note (Signed)
Date of Surgery: 04/15/2015 Date of Dictation: 04/15/2015 12:13 PM Pre-operative Diagnosis:  Nuclear Sclerotic Cataract, Posterior Subcapsular Cataract and Cortical Cataract right Eye Post-operative Diagnosis: same Procedure performed: Extra-capsular Cataract Extraction (ECCE) with placement of a posterior chamber intraocular lens (IOL) right Eye IOL:  Implant Name Type Inv. Item Serial No. Manufacturer Lot No. LRB No. Used  LENS IOL ACRYSOF IQ 20.5 - [id number] Intraocular Lens LENS IOL ACRYSOF IQ 20.5 [id number] ALCON [id number] Right 1   Anesthesia: 2% Lidocaine and 4% Marcaine in a 50/50 mixture with 10 unites/ml of Hylenex given as a peribulbar Anesthesiologist: Anesthesiologist: Elyse Hsu, MD CRNA: Allean Found, CRNA Complications: none Estimated Blood Loss: less than 1 ml  Description of procedure:  The patient was given anesthesia and sedation via intravenous access. The patient was then prepped and draped in the usual fashion. A 25-gauge needle was bent for initiating the capsulorhexis. A 5-0 silk suture was placed through the conjunctiva superior and inferiorly to serve as bridle sutures. Hemostasis was obtained at the superior limbus using an eraser cautery. A partial thickness groove was made at the anterior surgical limbus with a 64 Beaver blade and this was dissected anteriorly with an Avaya. The anterior chamber was entered at 10 o'clock with a 1.0 mm paracentesis knife and through the lamellar dissection with a 2.6 mm Alcon keratome. Epi-Shugarcaine 0.5 CC [9 cc BSS Plus (Alcon), 3 cc 4% preservative-free lidocaine (Hospira) and 4 cc 1:1000 preservative-free, bisulfite-free epinephrine] was injected into the anterior chamber via the paracentesis tract. Epi-Shugarcaine 0.5 CC [9 cc BSS Plus (Alcon), 3 cc 4% preservative-free lidocaine (Hospira) and 4 cc 1:1000 preservative-free, bisulfite-free epinephrine] was injected into the anterior chamber via the paracentesis  tract. DiscoVisc was injected to replace the aqueous and a continuous tear curvilinear capsulorhexis was performed using a bent 25-gauge needle.  Balance salt on a syringe was used to perform hydro-dissection and phacoemulsification was carried out using a divide and conquer technique. Procedure(s) with comments: CATARACT EXTRACTION PHACO AND INTRAOCULAR LENS PLACEMENT (IOC) (Right) - [place] 01:53 AP% 26.2 CDE 54.15 fluid pack lot # [id number]. Irrigation/aspiration was used to remove the residual cortex. There was a plaque on the medial aspect of the bag. The  I/A handpiece was used to vacuum some of the plaque. It was quite adherent and I was concerned the capsule might tear so it was left in place. The capsular bag was inflated with DiscoVisc. The intraocular lens was inserted into the capsular bag using a pre-loaded UltraSert Delivery System. Irrigation/aspiration was used to remove the residual DiscoVisc. The wound was inflated with balanced salt and checked for leaks. None were found. Miostat was injected via the paracentesis track and 0.1 ml of cefuroxime containing 1 mg of drug  was injected via the paracentesis track. The wound was checked for leaks again and none were found.   The bridal sutures were removed and two drops of Vigamox were placed on the eye. An eye shield was placed to protect the eye and the patient was discharged to the recovery area in good condition.   Marilyn Coleman

## 2015-04-15 NOTE — Transfer of Care (Signed)
Immediate Anesthesia Transfer of Care Note  Patient: Marilyn Coleman  Procedure(s) Performed: Procedure(s) with comments: CATARACT EXTRACTION PHACO AND INTRAOCULAR LENS PLACEMENT (IOC) (Right) - [place] 01:53 AP% 26.2 CDE 54.15 fluid pack lot # [id number]  Patient Location: PACU  Anesthesia Type:MAC  Level of Consciousness: awake  Airway & Oxygen Therapy: Patient Spontanous Breathing and Patient connected to nasal cannula oxygen  Post-op Assessment: Report given to RN and Post -op Vital signs reviewed and stable  Post vital signs: Reviewed and stable  Last Vitals:  Filed Vitals:   04/15/15 0958  BP: 136/83  Pulse: 86  Temp: 36.7 C  Resp: 16    Complications: No apparent anesthesia complications

## 2015-07-29 ENCOUNTER — Telehealth: Payer: Self-pay | Admitting: *Deleted

## 2015-07-29 NOTE — Telephone Encounter (Signed)
Notified patient that her annual follow up lung cancer screening low dose CT scan is due. Confirmed that patient is within age range of 55-77, asymptomatic of lung cancer, and no other serious disease processes that would make treatment of lung cancer not possible. The patient is a current/former smoker  with a 61pack year history. The shared decision making visit was completed 08/10/2014. The patient is agreeable for CT scan to be scheduled once insurance has approved it.

## 2015-08-02 ENCOUNTER — Encounter: Payer: Self-pay | Admitting: Family Medicine

## 2015-08-02 ENCOUNTER — Other Ambulatory Visit: Payer: Self-pay | Admitting: Family Medicine

## 2015-08-02 DIAGNOSIS — Z87891 Personal history of nicotine dependence: Secondary | ICD-10-CM

## 2015-08-02 HISTORY — DX: Personal history of nicotine dependence: Z87.891

## 2015-08-22 ENCOUNTER — Ambulatory Visit
Admission: RE | Admit: 2015-08-22 | Discharge: 2015-08-22 | Disposition: A | Discharge status: Home / Self Care | Payer: Medicare Other | Source: Ambulatory Visit | Attending: Family Medicine | Admitting: Family Medicine

## 2015-08-22 DIAGNOSIS — Z87891 Personal history of nicotine dependence: Secondary | ICD-10-CM | POA: Insufficient documentation

## 2015-08-22 DIAGNOSIS — I700 Atherosclerosis of aorta: Secondary | ICD-10-CM | POA: Insufficient documentation

## 2015-09-03 ENCOUNTER — Telehealth: Payer: Self-pay | Admitting: *Deleted

## 2015-09-03 NOTE — Telephone Encounter (Signed)
Notified patient of LDCT lung cancer screening results with recommendation for 12 month follow up imaging. Also notified of incidental finding noted below. Patient verbalizes understanding.   IMPRESSION: 1. Lung-RADS Category 1, negative. Continue annual screening with low-dose chest CT without contrast in 12 months. 2. Aortic atherosclerosis 3. Prior granulomatous disease.

## 2015-11-06 ENCOUNTER — Other Ambulatory Visit: Payer: Self-pay | Admitting: General Surgery

## 2015-11-06 DIAGNOSIS — Z853 Personal history of malignant neoplasm of breast: Secondary | ICD-10-CM

## 2015-11-18 ENCOUNTER — Inpatient Hospital Stay
Admission: RE | Admit: 2015-11-18 | Discharge: 2015-11-18 | Disposition: A | Discharge status: Home / Self Care | Payer: Self-pay | Source: Ambulatory Visit | Attending: *Deleted | Admitting: *Deleted

## 2015-11-18 ENCOUNTER — Other Ambulatory Visit: Payer: Self-pay | Admitting: *Deleted

## 2015-11-18 DIAGNOSIS — Z9289 Personal history of other medical treatment: Secondary | ICD-10-CM

## 2015-11-21 ENCOUNTER — Other Ambulatory Visit: Payer: Self-pay | Admitting: *Deleted

## 2015-11-25 ENCOUNTER — Other Ambulatory Visit: Payer: Self-pay | Admitting: General Surgery

## 2015-11-25 DIAGNOSIS — Z1211 Encounter for screening for malignant neoplasm of colon: Secondary | ICD-10-CM

## 2015-11-25 DIAGNOSIS — Z1231 Encounter for screening mammogram for malignant neoplasm of breast: Secondary | ICD-10-CM

## 2015-11-26 ENCOUNTER — Encounter: Payer: Self-pay | Admitting: *Deleted

## 2015-12-17 ENCOUNTER — Ambulatory Visit: Payer: Medicare Other

## 2015-12-30 ENCOUNTER — Encounter: Payer: Self-pay | Admitting: *Deleted

## 2015-12-31 ENCOUNTER — Ambulatory Visit
Admission: RE | Admit: 2015-12-31 | Discharge: 2015-12-31 | Disposition: A | Discharge status: Home / Self Care | Payer: Medicare Other | Source: Ambulatory Visit | Attending: General Surgery | Admitting: General Surgery

## 2015-12-31 ENCOUNTER — Other Ambulatory Visit: Payer: Self-pay | Admitting: General Surgery

## 2015-12-31 DIAGNOSIS — R928 Other abnormal and inconclusive findings on diagnostic imaging of breast: Secondary | ICD-10-CM | POA: Insufficient documentation

## 2015-12-31 DIAGNOSIS — Z1231 Encounter for screening mammogram for malignant neoplasm of breast: Secondary | ICD-10-CM

## 2016-01-01 ENCOUNTER — Other Ambulatory Visit: Payer: Self-pay | Admitting: General Surgery

## 2016-01-01 DIAGNOSIS — N6320 Unspecified lump in the left breast, unspecified quadrant: Secondary | ICD-10-CM

## 2016-01-01 DIAGNOSIS — R928 Other abnormal and inconclusive findings on diagnostic imaging of breast: Secondary | ICD-10-CM

## 2016-01-02 ENCOUNTER — Ambulatory Visit: Payer: Medicare Other | Admitting: General Surgery

## 2016-01-06 ENCOUNTER — Ambulatory Visit: Payer: Medicare Other | Admitting: General Surgery

## 2016-01-09 ENCOUNTER — Ambulatory Visit
Admission: RE | Admit: 2016-01-09 | Discharge: 2016-01-09 | Disposition: A | Discharge status: Home / Self Care | Payer: Medicare Other | Source: Ambulatory Visit | Attending: General Surgery | Admitting: General Surgery

## 2016-01-09 DIAGNOSIS — R928 Other abnormal and inconclusive findings on diagnostic imaging of breast: Secondary | ICD-10-CM

## 2016-01-13 ENCOUNTER — Encounter: Payer: Self-pay | Admitting: *Deleted

## 2016-01-16 ENCOUNTER — Ambulatory Visit (INDEPENDENT_AMBULATORY_CARE_PROVIDER_SITE_OTHER): Payer: Medicare Other | Admitting: General Surgery

## 2016-01-16 ENCOUNTER — Encounter: Payer: Self-pay | Admitting: General Surgery

## 2016-01-16 VITALS — BP 112/64 | HR 88 | Resp 12 | Ht 67.0 in | Wt 134.0 lb

## 2016-01-16 DIAGNOSIS — Z853 Personal history of malignant neoplasm of breast: Secondary | ICD-10-CM

## 2016-01-16 NOTE — Patient Instructions (Addendum)
Return in one year left breast screening mammogram

## 2016-01-16 NOTE — Progress Notes (Signed)
Patient ID: Marilyn Coleman, female   DOB: [birth year], 70 y.o.   MRN: [id number]  Chief Complaint  Patient presents with  . Follow-up    mammogram    HPI Marilyn Coleman is a 70 y.o. female who presents for a breast cancer follow up. The most recent mammogram was done on 01/09/16 .  Patient does perform regular self breast checks and gets regular mammograms done.   I have reviewed the history of present illness with the patient.  HPI  Past Medical History:  Diagnosis Date  . Allergy   . Cancer ([place]) 2012   face x 4  . Cancer [facility]) 1987   right breast. mastectomy  . Detached retina   . Iritis 2005  . Personal history of malignant neoplasm of breast 1987   right mastectomy  . Personal history of tobacco use, presenting hazards to health   . Personal history of tobacco use, presenting hazards to health 08/02/2015  . Special screening for malignant neoplasms, colon   . Tobacco abuse 08/08/2014    Past Surgical History:  Procedure Laterality Date  . BREAST SURGERY Right    mastectomy  . CATARACT EXTRACTION W/ INTRAOCULAR LENS IMPLANT Left   . CATARACT EXTRACTION W/PHACO Right 04/15/2015   Procedure: CATARACT EXTRACTION PHACO AND INTRAOCULAR LENS PLACEMENT (IOC);  Surgeon: Estill Cotta, MD;  Location: [facility] ORS;  Service: Ophthalmology;  Laterality: Right;  [place] 01:53 AP% 26.2 CDE 54.15 fluid pack lot # [id number]  . COLONOSCOPY  2010   Dr. Vira Agar  . EYE SURGERY    . MASTECTOMY Right 1987   with chemo tx  . RETINAL DETACHMENT SURGERY  10/2014  . SKIN CANCER EXCISION  2010,2012   face x 4  . TONSILLECTOMY  1962    Family History  Problem Relation Age of Onset  . Cancer Other     breast cancer FH  . Breast cancer Mother   . Breast cancer Paternal Grandmother   . Diabetes Neg Hx   . Heart disease Neg Hx   . Colon cancer Neg Hx   . Ovarian cancer Neg Hx     Social History Social History  Substance Use Topics  . Smoking status: Current Every Day Smoker    Packs/day: 1.00     Years: 30.00    Types: Cigarettes  . Smokeless tobacco: Never Used  . Alcohol use 0.0 oz/week     Comment: ocassionally    Allergies  Allergen Reactions  . Acrylic Polymer [Carbomer] Itching    Allergy is to acrylic acid  . Eggs Or Egg-Derived Products Other (See Comments)    Joint pain    Current Outpatient Prescriptions  Medication Sig Dispense Refill  . Alpha-Lipoic Acid 50 MG CAPS     . ASTRAGALUS PO Take by mouth.    . Calcium Citrate 250 MG TABS     . CHROMIUM PO     . Co-Enzyme Q-10 30 MG CAPS     . estradiol (ESTRACE) 0.1 MG/GM vaginal cream Place 1 Applicatorful vaginally once a week.    Marland Kitchen GRAPE SEED EXTRACT PO Take 1 tablet by mouth daily.    . Lecithin 1200 MG CAPS Take 1 capsule by mouth daily.    Marland Kitchen loratadine (CLARITIN) 10 MG tablet Take 10 mg by mouth.    . Milk Thistle 250 MG CAPS Take 1 capsule by mouth daily.    . Multiple Vitamin (MULTI-VITAMINS) TABS Take by mouth.    . Multiple Vitamins-Minerals (WOMENS BONE  HEALTH PO) Take 1 tablet by mouth daily.    Marland Kitchen selenium 50 MCG TABS tablet     . vitamin C (ASCORBIC ACID) 250 MG tablet Take 750 mg by mouth daily.    . Zinc 30 MG TABS Take 1 tablet by mouth daily.     No current facility-administered medications for this visit.     Review of Systems Review of Systems  Constitutional: Negative.   Respiratory: Negative.   Cardiovascular: Negative.     Blood pressure 112/64, pulse 88, resp. rate 12, height 5\' 7"  (1.702 m), weight 134 lb (60.8 kg).  Physical Exam Physical Exam  Constitutional: She is oriented to person, place, and time. She appears well-developed and well-nourished.  Eyes: Conjunctivae are normal. No scleral icterus.  Neck: Neck supple.  Cardiovascular: Normal rate, regular rhythm and normal heart sounds.   Pulmonary/Chest: Effort normal and breath sounds normal. Left breast exhibits no inverted nipple, no mass, no nipple discharge, no skin change and no tenderness.  Right mastectomy site  is well healed, no signs of local recurrence  Abdominal: Soft. Bowel sounds are normal. There is no tenderness.  Lymphadenopathy:    She has no cervical adenopathy.    She has no axillary adenopathy.  Neurological: She is alert and oriented to person, place, and time.  Skin: Skin is warm and dry.    Data Reviewed Mammogram reviewed   Assessment    History of right breast cancer. Stable exam    Plan    Return in one year with left breast screening mammogram   This information has been scribed by Gaspar Cola CMA.        Kodie Pick G 01/16/2016, 2:45 PM

## 2016-03-02 NOTE — Progress Notes (Signed)
Patient ID: Marilyn Coleman, female   DOB: [birth year], 70 y.o.   MRN: [id number] ANNUAL PREVENTATIVE CARE GYN  ENCOUNTER NOTE  Subjective:       Marilyn Coleman is a 70 y.o. G2P2 female here for a routine annual gynecologic exam.  Current complaints: 1.  Medicare breast and pelvic  2.  History of breast cancer, followed by Dr. Jamal Collin 3.  History of osteoporosis; Taking calcium with vitamin D 4.  Vaginal atrophy; using Estrace cream intravaginal approximately once a week   Gynecologic History No LMP recorded. Patient is postmenopausal. Contraception: post menopausal status Last Pap: 2015. Results were: normal Last mammogram: 12/2015 birad-1 thru Dr. Jamal Collin . Results were: normal History of osteoporosis on DEXA scan in 2012  Obstetric History OB History  Gravida Para Term Preterm AB Living  2 2       2   SAB TAB Ectopic Multiple Live Births               # Outcome Date GA Lbr Len/2nd Weight Sex Delivery Anes PTL Lv  2 Para           1 Para             Obstetric Comments  Age with first menstruation-11  Age with first pregnancy-24  LMP-1988    Past Medical History:  Diagnosis Date  . Allergy   . Cancer ([place]) 2012   face x 4  . Cancer [facility]) 1987   right breast. mastectomy  . Detached retina   . Iritis 2005  . Personal history of malignant neoplasm of breast 1987   right mastectomy  . Personal history of tobacco use, presenting hazards to health   . Personal history of tobacco use, presenting hazards to health 08/02/2015  . Special screening for malignant neoplasms, colon   . Tobacco abuse 08/08/2014    Past Surgical History:  Procedure Laterality Date  . BREAST SURGERY Right    mastectomy  . CATARACT EXTRACTION W/ INTRAOCULAR LENS IMPLANT Left   . CATARACT EXTRACTION W/PHACO Right 04/15/2015   Procedure: CATARACT EXTRACTION PHACO AND INTRAOCULAR LENS PLACEMENT (IOC);  Surgeon: Estill Cotta, MD;  Location: [facility] ORS;  Service: Ophthalmology;  Laterality: Right;  [place]  01:53 AP% 26.2 CDE 54.15 fluid pack lot # [id number]  . COLONOSCOPY  2010   Dr. Vira Agar  . EYE SURGERY    . MASTECTOMY Right 1987   with chemo tx  . RETINAL DETACHMENT SURGERY  10/2014  . SKIN CANCER EXCISION  2010,2012   face x 4  . TONSILLECTOMY  1962    Current Outpatient Prescriptions on File Prior to Visit  Medication Sig Dispense Refill  . Alpha-Lipoic Acid 50 MG CAPS     . ASTRAGALUS PO Take by mouth.    . Calcium Citrate 250 MG TABS     . CHROMIUM PO     . Co-Enzyme Q-10 30 MG CAPS     . estradiol (ESTRACE) 0.1 MG/GM vaginal cream Place 1 Applicatorful vaginally once a week.    Marland Kitchen GRAPE SEED EXTRACT PO Take 1 tablet by mouth daily.    . Lecithin 1200 MG CAPS Take 1 capsule by mouth daily.    Marland Kitchen loratadine (CLARITIN) 10 MG tablet Take 10 mg by mouth.    . Milk Thistle 250 MG CAPS Take 1 capsule by mouth daily.    . Multiple Vitamin (MULTI-VITAMINS) TABS Take by mouth.    . Multiple Vitamins-Minerals (WOMENS BONE HEALTH PO) Take 1 tablet  by mouth daily.    Marland Kitchen selenium 50 MCG TABS tablet     . vitamin C (ASCORBIC ACID) 250 MG tablet Take 750 mg by mouth daily.    . Zinc 30 MG TABS Take 1 tablet by mouth daily.     No current facility-administered medications on file prior to visit.     Allergies  Allergen Reactions  . Acrylic Polymer [Carbomer] Itching    Allergy is to acrylic acid  . Eggs Or Egg-Derived Products Other (See Comments)    Joint pain    Social History   Social History  . Marital status: Married    Spouse name: N/A  . Number of children: N/A  . Years of education: N/A   Occupational History  . Not on file.   Social History Main Topics  . Smoking status: Current Every Day Smoker    Packs/day: 1.00    Years: 30.00    Types: Cigarettes  . Smokeless tobacco: Never Used  . Alcohol use 0.0 oz/week     Comment: ocassionally  . Drug use: No  . Sexual activity: Yes    Birth control/ protection: Post-menopausal   Other Topics Concern  . Not on  file   Social History Narrative  . No narrative on file    Family History  Problem Relation Age of Onset  . Cancer Other     breast cancer FH  . Breast cancer Mother   . Breast cancer Paternal Grandmother   . Diabetes Neg Hx   . Heart disease Neg Hx   . Colon cancer Neg Hx   . Ovarian cancer Neg Hx     The following portions of the patient's history were reviewed and updated as appropriate: allergies, current medications, past family history, past medical history, past social history, past surgical history and problem list.  Review of Systems ROS Review of Systems - General ROS: negative for - chills, fatigue, fever, hot flashes, night sweats, weight gain or weight loss Psychological ROS: negative for - anxiety, decreased libido, depression, mood swings, physical abuse or sexual abuse Ophthalmic ROS: negative for - blurry vision, eye pain or loss of vision ENT ROS: negative for - headaches, hearing change, visual changes or vocal changes Allergy and Immunology ROS: negative for - hives, itchy/watery eyes or seasonal allergies Hematological and Lymphatic ROS: negative for - bleeding problems, bruising, swollen lymph nodes or weight loss Endocrine ROS: negative for - galactorrhea, hair pattern changes, hot flashes, malaise/lethargy, mood swings, palpitations, polydipsia/polyuria, skin changes, temperature intolerance or unexpected weight changes Breast ROS: negative for - new or changing breast lumps or nipple discharge Respiratory ROS: negative for - cough or shortness of breath Cardiovascular ROS: negative for - chest pain, irregular heartbeat, palpitations or shortness of breath Gastrointestinal ROS: no abdominal pain, change in bowel habits, or black or bloody stools Genito-Urinary ROS: no dysuria, trouble voiding, or hematuria Musculoskeletal ROS: negative for - joint pain or joint stiffness Neurological ROS: negative for - bowel and bladder control changes Dermatological ROS:  negative for rash and skin lesion changes   Objective:   BP 125/79   Pulse 92   Ht 5\' 7"  (1.702 m)   Wt 138 lb 4.8 oz (62.7 kg)   BMI 21.66 kg/m  CONSTITUTIONAL: Well-developed, well-nourished female in no acute distress.  PSYCHIATRIC: Normal mood and affect. Normal behavior. Normal judgment and thought content. [place]: Alert and oriented to person, place, and time. Normal muscle tone coordination. No cranial nerve deficit noted. HENT:  Normocephalic, atraumatic, External right and left ear normal. Oropharynx is clear and moist EYES: Conjunctivae and EOM are normal. Pupils are equal, round, and reactive to light. No scleral icterus.  NECK: Normal range of motion, supple, no masses.  Normal thyroid.  SKIN: Skin is warm and dry. No rash noted. Not diaphoretic. No erythema. No pallor. CARDIOVASCULAR: Normal heart rate noted, regular rhythm, no murmur. RESPIRATORY: Clear to auscultation bilaterally. Effort and breath sounds normal, no problems with respiration noted. BREASTS: Right mastectomy scar, healed; no tenderness or mass palpated left breast without mass, skin changes, nipple drainage, or lymphadenopathy. ABDOMEN: Soft, normal bowel sounds, no distention noted.  No tenderness, rebound or guarding.  BLADDER: Normal PELVIC:  External Genitalia: Normal  BUS: Normal  Vagina: Normal; minimal vaginal atrophy  Cervix: Normal; no lesions; Pap smear is taken  Uterus: Normal; Midplane, nontender, mobile  Adnexa: Normal; Nonpalpable and nontender  RV: External Exam NormaI, No Rectal Masses and Normal Sphincter tone  MUSCULOSKELETAL: Normal range of motion. No tenderness.  No cyanosis, clubbing, or edema.  2+ distal pulses. LYMPHATIC: No Axillary, Supraclavicular, or Inguinal Adenopathy.    Assessment:   Annual gynecologic examination 70 y.o. Contraception: post menopausal status Normal BMI History of breast cancer, no evidence of disease. Osteoporosis. Vaginal atrophy  Plan:   Pap: Not needed Mammogram: thru dr Jamal Collin Stool Guaiac Testing:  Declines- pt will have colonoscopy 03/2016 Labs: thru pcp Routine preventative health maintenance measures emphasized: Exercise/Diet/Weight control, Tobacco Warnings and Alcohol/Substance use risks Continue calcium with vitamin D supplementation Continue with Estrace cream 1 g intravaginal once or twice a week Patient declines trial of Intrarosa suppositories for vaginal atrophy Return to [place], CMA  Brayton Mars, MD     Note: This dictation was prepared with Dragon dictation along with smaller phrase technology. Any transcriptional errors that result from this process are unintentional.

## 2016-03-03 ENCOUNTER — Encounter: Payer: Self-pay | Admitting: Obstetrics and Gynecology

## 2016-03-03 ENCOUNTER — Ambulatory Visit (INDEPENDENT_AMBULATORY_CARE_PROVIDER_SITE_OTHER): Payer: Medicare Other | Admitting: Obstetrics and Gynecology

## 2016-03-03 VITALS — BP 125/79 | HR 92 | Ht 67.0 in | Wt 138.3 lb

## 2016-03-03 DIAGNOSIS — Z9189 Other specified personal risk factors, not elsewhere classified: Secondary | ICD-10-CM

## 2016-03-03 DIAGNOSIS — Z780 Asymptomatic menopausal state: Secondary | ICD-10-CM

## 2016-03-03 DIAGNOSIS — M810 Age-related osteoporosis without current pathological fracture: Secondary | ICD-10-CM | POA: Diagnosis not present

## 2016-03-03 DIAGNOSIS — Z124 Encounter for screening for malignant neoplasm of cervix: Secondary | ICD-10-CM

## 2016-03-03 DIAGNOSIS — Z853 Personal history of malignant neoplasm of breast: Secondary | ICD-10-CM

## 2016-03-03 DIAGNOSIS — N952 Postmenopausal atrophic vaginitis: Secondary | ICD-10-CM | POA: Diagnosis not present

## 2016-03-03 NOTE — Progress Notes (Signed)
Pap smear was ordered- pap w/rflx. Per pt request.

## 2016-03-03 NOTE — Addendum Note (Signed)
Addended by: Elouise Munroe on: 03/03/2016 03:31 PM   Modules accepted: Orders

## 2016-03-03 NOTE — Patient Instructions (Signed)
1. Pap smear is done 2. Mammograms are to be obtained per Dr. Jamal Collin 3. Patient is to have colonoscopy in January 2018 4. Continue with calcium and vitamin D supplementation along with weightbearing exercise 5. Return in 1 year   Health Maintenance for Postmenopausal Women Introduction Menopause is a normal process in which your reproductive ability comes to an end. This process happens gradually over a span of months to years, usually between the ages of 6 and 64. Menopause is complete when you have missed 12 consecutive menstrual periods. It is important to talk with your health care provider about some of the most common conditions that affect postmenopausal women, such as heart disease, cancer, and bone loss (osteoporosis). Adopting a healthy lifestyle and getting preventive care can help to promote your health and wellness. Those actions can also lower your chances of developing some of these common conditions. What should I know about menopause? During menopause, you may experience a number of symptoms, such as:  Moderate-to-severe hot flashes.  Night sweats.  Decrease in sex drive.  Mood swings.  Headaches.  Tiredness.  Irritability.  Memory problems.  Insomnia. Choosing to treat or not to treat menopausal changes is an individual decision that you make with your health care provider. What should I know about hormone replacement therapy and supplements? Hormone therapy products are effective for treating symptoms that are associated with menopause, such as hot flashes and night sweats. Hormone replacement carries certain risks, especially as you become older. If you are thinking about using estrogen or estrogen with progestin treatments, discuss the benefits and risks with your health care provider. What should I know about heart disease and stroke? Heart disease, heart attack, and stroke become more likely as you age. This may be due, in part, to the hormonal changes that  your body experiences during menopause. These can affect how your body processes dietary fats, triglycerides, and cholesterol. Heart attack and stroke are both medical emergencies. There are many things that you can do to help prevent heart disease and stroke:  Have your blood pressure checked at least every 1-2 years. High blood pressure causes heart disease and increases the risk of stroke.  If you are 33-75 years old, ask your health care provider if you should take aspirin to prevent a heart attack or a stroke.  Do not use any tobacco products, including cigarettes, chewing tobacco, or electronic cigarettes. If you need help quitting, ask your health care provider.  It is important to eat a healthy diet and maintain a healthy weight.  Be sure to include plenty of vegetables, fruits, low-fat dairy products, and lean protein.  Avoid eating foods that are high in solid fats, added sugars, or salt (sodium).  Get regular exercise. This is one of the most important things that you can do for your health.  Try to exercise for at least 150 minutes each week. The type of exercise that you do should increase your heart rate and make you sweat. This is known as moderate-intensity exercise.  Try to do strengthening exercises at least twice each week. Do these in addition to the moderate-intensity exercise.  Know your numbers.Ask your health care provider to check your cholesterol and your blood glucose. Continue to have your blood tested as directed by your health care provider. What should I know about cancer screening? There are several types of cancer. Take the following steps to reduce your risk and to catch any cancer development as early as possible. Breast Cancer  Practice breast self-awareness.  This means understanding how your breasts normally appear and feel.  It also means doing regular breast self-exams. Let your health care provider know about any changes, no matter how  small.  If you are 58 or older, have a clinician do a breast exam (clinical breast exam or CBE) every year. Depending on your age, family history, and medical history, it may be recommended that you also have a yearly breast X-ray (mammogram).  If you have a family history of breast cancer, talk with your health care provider about genetic screening.  If you are at high risk for breast cancer, talk with your health care provider about having an MRI and a mammogram every year.  Breast cancer (BRCA) gene test is recommended for women who have family members with BRCA-related cancers. Results of the assessment will determine the need for genetic counseling and BRCA1 and for BRCA2 testing. BRCA-related cancers include these types:  Breast. This occurs in males or females.  Ovarian.  Tubal. This may also be called fallopian tube cancer.  Cancer of the abdominal or pelvic lining (peritoneal cancer).  Prostate.  Pancreatic. Cervical, Uterine, and Ovarian Cancer  Your health care provider may recommend that you be screened regularly for cancer of the pelvic organs. These include your ovaries, uterus, and vagina. This screening involves a pelvic exam, which includes checking for microscopic changes to the surface of your cervix (Pap test).  For women ages 21-65, health care providers may recommend a pelvic exam and a Pap test every three years. For women ages 49-65, they may recommend the Pap test and pelvic exam, combined with testing for human papilloma virus (HPV), every five years. Some types of HPV increase your risk of cervical cancer. Testing for HPV may also be done on women of any age who have unclear Pap test results.  Other health care providers may not recommend any screening for nonpregnant women who are considered low risk for pelvic cancer and have no symptoms. Ask your health care provider if a screening pelvic exam is right for you.  If you have had past treatment for cervical  cancer or a condition that could lead to cancer, you need Pap tests and screening for cancer for at least 20 years after your treatment. If Pap tests have been discontinued for you, your risk factors (such as having a new sexual partner) need to be reassessed to determine if you should start having screenings again. Some women have medical problems that increase the chance of getting cervical cancer. In these cases, your health care provider may recommend that you have screening and Pap tests more often.  If you have a family history of uterine cancer or ovarian cancer, talk with your health care provider about genetic screening.  If you have vaginal bleeding after reaching menopause, tell your health care provider.  There are currently no reliable tests available to screen for ovarian cancer. Lung Cancer  Lung cancer screening is recommended for adults 74-77 years old who are at high risk for lung cancer because of a history of smoking. A yearly low-dose CT scan of the lungs is recommended if you:  Currently smoke.  Have a history of at least 30 pack-years of smoking and you currently smoke or have quit within the past 15 years. A pack-year is smoking an average of one pack of cigarettes per day for one year. Yearly screening should:  Continue until it has been 15 years since you quit.  Stop  if you develop a health problem that would prevent you from having lung cancer treatment. Colorectal Cancer  This type of cancer can be detected and can often be prevented.  Routine colorectal cancer screening usually begins at age 7 and continues through age 33.  If you have risk factors for colon cancer, your health care provider may recommend that you be screened at an earlier age.  If you have a family history of colorectal cancer, talk with your health care provider about genetic screening.  Your health care provider may also recommend using home test kits to check for hidden blood in your  stool.  A small camera at the end of a tube can be used to examine your colon directly (sigmoidoscopy or colonoscopy). This is done to check for the earliest forms of colorectal cancer.  Direct examination of the colon should be repeated every 5-10 years until age 77. However, if early forms of precancerous polyps or small growths are found or if you have a family history or genetic risk for colorectal cancer, you may need to be screened more often. Skin Cancer  Check your skin from head to toe regularly.  Monitor any moles. Be sure to tell your health care provider:  About any new moles or changes in moles, especially if there is a change in a mole's shape or color.  If you have a mole that is larger than the size of a pencil eraser.  If any of your family members has a history of skin cancer, especially at a young age, talk with your health care provider about genetic screening.  Always use sunscreen. Apply sunscreen liberally and repeatedly throughout the day.  Whenever you are outside, protect yourself by wearing long sleeves, pants, a wide-brimmed hat, and sunglasses. What should I know about osteoporosis? Osteoporosis is a condition in which bone destruction happens more quickly than new bone creation. After menopause, you may be at an increased risk for osteoporosis. To help prevent osteoporosis or the bone fractures that can happen because of osteoporosis, the following is recommended:  If you are 56-34 years old, get at least 1,000 mg of calcium and at least 600 mg of vitamin D per day.  If you are older than age 61 but younger than age 19, get at least 1,200 mg of calcium and at least 600 mg of vitamin D per day.  If you are older than age 69, get at least 1,200 mg of calcium and at least 800 mg of vitamin D per day. Smoking and excessive alcohol intake increase the risk of osteoporosis. Eat foods that are rich in calcium and vitamin D, and do weight-bearing exercises several  times each week as directed by your health care provider. What should I know about how menopause affects my mental health? Depression may occur at any age, but it is more common as you become older. Common symptoms of depression include:  Low or sad mood.  Changes in sleep patterns.  Changes in appetite or eating patterns.  Feeling an overall lack of motivation or enjoyment of activities that you previously enjoyed.  Frequent crying spells. Talk with your health care provider if you think that you are experiencing depression. What should I know about immunizations? It is important that you get and maintain your immunizations. These include:  Tetanus, diphtheria, and pertussis (Tdap) booster vaccine.  Influenza every year before the flu season begins.  Pneumonia vaccine.  Shingles vaccine. Your health care provider may also recommend other  immunizations. This information is not intended to replace advice given to you by your health care provider. Make sure you discuss any questions you have with your health care provider. Document Released: 05/08/2005 Document Revised: 10/04/2015 Document Reviewed: 12/18/2014  2017 Elsevier

## 2016-03-18 LAB — PAP IG W/ RFLX HPV ASCU: PAP Smear Comment: 0

## 2016-04-17 ENCOUNTER — Encounter
Admission: RE | Disposition: A | Discharge status: Home / Self Care | Payer: Self-pay | Source: Ambulatory Visit | Attending: Unknown Physician Specialty

## 2016-04-17 ENCOUNTER — Ambulatory Visit
Admission: RE | Admit: 2016-04-17 | Discharge: 2016-04-17 | Disposition: A | Discharge status: Home / Self Care | Payer: Medicare Other | Source: Ambulatory Visit | Attending: Unknown Physician Specialty | Admitting: Unknown Physician Specialty

## 2016-04-17 ENCOUNTER — Ambulatory Visit: Payer: Medicare Other | Admitting: Anesthesiology

## 2016-04-17 ENCOUNTER — Encounter: Payer: Self-pay | Admitting: *Deleted

## 2016-04-17 DIAGNOSIS — Z79899 Other long term (current) drug therapy: Secondary | ICD-10-CM | POA: Diagnosis not present

## 2016-04-17 DIAGNOSIS — Z9011 Acquired absence of right breast and nipple: Secondary | ICD-10-CM | POA: Diagnosis not present

## 2016-04-17 DIAGNOSIS — K640 First degree hemorrhoids: Secondary | ICD-10-CM | POA: Diagnosis not present

## 2016-04-17 DIAGNOSIS — Z85828 Personal history of other malignant neoplasm of skin: Secondary | ICD-10-CM | POA: Insufficient documentation

## 2016-04-17 DIAGNOSIS — Z9221 Personal history of antineoplastic chemotherapy: Secondary | ICD-10-CM | POA: Insufficient documentation

## 2016-04-17 DIAGNOSIS — Z86010 Personal history of colonic polyps: Secondary | ICD-10-CM | POA: Insufficient documentation

## 2016-04-17 DIAGNOSIS — K648 Other hemorrhoids: Secondary | ICD-10-CM | POA: Diagnosis not present

## 2016-04-17 DIAGNOSIS — K5730 Diverticulosis of large intestine without perforation or abscess without bleeding: Secondary | ICD-10-CM | POA: Insufficient documentation

## 2016-04-17 DIAGNOSIS — Z853 Personal history of malignant neoplasm of breast: Secondary | ICD-10-CM | POA: Insufficient documentation

## 2016-04-17 DIAGNOSIS — Z7982 Long term (current) use of aspirin: Secondary | ICD-10-CM | POA: Diagnosis not present

## 2016-04-17 DIAGNOSIS — Z1211 Encounter for screening for malignant neoplasm of colon: Secondary | ICD-10-CM | POA: Insufficient documentation

## 2016-04-17 DIAGNOSIS — F17210 Nicotine dependence, cigarettes, uncomplicated: Secondary | ICD-10-CM | POA: Insufficient documentation

## 2016-04-17 HISTORY — PX: COLONOSCOPY WITH PROPOFOL: SHX5780

## 2016-04-17 SURGERY — COLONOSCOPY WITH PROPOFOL
Anesthesia: General

## 2016-04-17 MED ORDER — PROPOFOL 500 MG/50ML IV EMUL
INTRAVENOUS | Status: DC | PRN
  Administered 2016-04-17: 100 ug/kg/min via INTRAVENOUS

## 2016-04-17 MED ORDER — PROPOFOL 10 MG/ML IV BOLUS
INTRAVENOUS | Status: DC | PRN
  Administered 2016-04-17: 80 mg via INTRAVENOUS

## 2016-04-17 MED ORDER — SODIUM CHLORIDE 0.9 % IV SOLN: INTRAVENOUS | Status: DC

## 2016-04-17 MED ORDER — SODIUM CHLORIDE 0.9 % IV SOLN
INTRAVENOUS | Status: DC
  Administered 2016-04-17: 07:00:00 via INTRAVENOUS

## 2016-04-17 MED ORDER — PROPOFOL 500 MG/50ML IV EMUL
INTRAVENOUS | Status: AC
  Filled 2016-04-17: qty 50

## 2016-04-17 NOTE — Transfer of Care (Signed)
Immediate Anesthesia Transfer of Care Note  Patient: Marilyn Coleman  Procedure(s) Performed: Procedure(s): COLONOSCOPY WITH PROPOFOL (N/A)  Patient Location: PACU and Endoscopy Unit  Anesthesia Type:General  Level of Consciousness: patient cooperative and lethargic  Airway & Oxygen Therapy: Patient Spontanous Breathing and Patient connected to nasal cannula oxygen  Post-op Assessment: Report given to RN and Post -op Vital signs reviewed and stable  Post vital signs: Reviewed and stable  Last Vitals:  Vitals:   04/17/16 0825 04/17/16 0826  BP: 97/65 97/65  Pulse: 80 80  Resp: 16 18  Temp: (!) 35.7 C     Last Pain:  Vitals:   04/17/16 0825  TempSrc: Tympanic         Complications: No apparent anesthesia complications

## 2016-04-17 NOTE — Op Note (Signed)
[facility] Gastroenterology Patient Name: Marilyn Coleman Procedure Date: 04/17/2016 8:18 AM MRN: [id number] Account #: [id number] Date of Birth: November 13, 1945 Admit Type: Outpatient Age: 71 Room: [facility] ENDO ROOM 1 Gender: Female Note Status: Finalized Procedure:            Colonoscopy Indications:          High risk colon cancer surveillance: Personal history                        of colonic polyps Providers:            Manya Silvas, MD Referring MD:         Ramonita Lab, MD (Referring MD) Medicines:            Propofol per Anesthesia Complications:        No immediate complications. Procedure:            Pre-Anesthesia Assessment:                       - After reviewing the risks and benefits, the patient                        was deemed in satisfactory condition to undergo the                        procedure.                       After obtaining informed consent, the colonoscope was                        passed under direct vision. Throughout the procedure,                        the patient's blood pressure, pulse, and oxygen                        saturations were monitored continuously. The                        Colonoscope was introduced through the anus and                        advanced to the the cecum, identified by appendiceal                        orifice and ileocecal valve. The colonoscopy was                        performed without difficulty. The patient tolerated the                        procedure well. The quality of the bowel preparation                        was excellent. Findings:      The Provation system was not working at the beginning of this procedure       so I could not take a picture of the cecum but while the procedure was       in progress the Provation system began to work and a  picture of distal       sigmoid showing a few small diverticuli was taken.      Multiple small-mouthed diverticula were found in the sigmoid colon  and       descending colon.      Internal hemorrhoids were found during endoscopy. The hemorrhoids were       small and Grade I (internal hemorrhoids that do not prolapse).      The exam was otherwise without abnormality. Impression:           - Diverticulosis in the sigmoid colon and in the                        descending colon.                       - Internal hemorrhoids.                       - The examination was otherwise normal.                       - No specimens collected. Recommendation:       - Repeat colonoscopy in 5 years for surveillance. Manya Silvas, MD 04/17/2016 8:25:05 AM This report has been signed electronically. Number of Addenda: 0 Note Initiated On: 04/17/2016 8:18 AM Scope Withdrawal Time: 0 hours 10 minutes 55 seconds  Total Procedure Duration: 0 hours 23 minutes 9 seconds       [facility]

## 2016-04-17 NOTE — Anesthesia Post-op Follow-up Note (Cosign Needed)
Anesthesia QCDR form completed.        

## 2016-04-17 NOTE — Anesthesia Postprocedure Evaluation (Signed)
Anesthesia Post Note  Patient: Sunflower  Procedure(s) Performed: Procedure(s) (LRB): COLONOSCOPY WITH PROPOFOL (N/A)  Patient location during evaluation: Endoscopy Anesthesia Type: General Level of consciousness: awake and alert Pain management: pain level controlled Vital Signs Assessment: post-procedure vital signs reviewed and stable Respiratory status: spontaneous breathing, nonlabored ventilation, respiratory function stable and patient connected to nasal cannula oxygen Cardiovascular status: blood pressure returned to baseline and stable Postop Assessment: no signs of nausea or vomiting Anesthetic complications: no     Last Vitals:  Vitals:   04/17/16 0838 04/17/16 0845  BP:  110/73  Pulse: 83 85  Resp: 15 12  Temp:      Last Pain:  Vitals:   04/17/16 0825  TempSrc: Tympanic                 Precious Haws Zacherie Honeyman

## 2016-04-17 NOTE — Anesthesia Preprocedure Evaluation (Addendum)
Anesthesia Evaluation  Patient identified by MRN, date of birth, ID band Patient awake    Reviewed: Allergy & Precautions, H&P , NPO status , Patient's Chart, lab work & pertinent test results  History of Anesthesia Complications Negative for: history of anesthetic complications  Airway Mallampati: III  TM Distance: <3 FB Neck ROM: limited    Dental  (+) Poor Dentition, Chipped, Caps   Pulmonary neg shortness of breath, Current Smoker,    Pulmonary exam normal breath sounds clear to auscultation       Cardiovascular Exercise Tolerance: Good (-) angina(-) Past MI and (-) DOE negative cardio ROS Normal cardiovascular exam Rhythm:regular Rate:Normal     Neuro/Psych negative neurological ROS  negative psych ROS   GI/Hepatic negative GI ROS, Neg liver ROS, neg GERD  ,  Endo/Other  negative endocrine ROS  Renal/GU negative Renal ROS  negative genitourinary   Musculoskeletal   Abdominal   Peds  Hematology negative hematology ROS (+)   Anesthesia Other Findings 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   Past Medical History: No date: Allergy 2012: Cancer ([place])     Comment: face x 4 1987: Cancer ([place])     Comment: right breast. mastectomy No date: Detached retina 2005: Iritis 1987: Personal history of malignant neoplasm of brea*     Comment: right mastectomy No date: Personal history of tobacco use, presenting ha* 08/02/2015: Personal history of tobacco use, presenting ha* No date: Special screening for malignant neoplasms, col* 08/08/2014: Tobacco abuse  Past Surgical History: No date: BREAST SURGERY Right     Comment: mastectomy No date: CATARACT EXTRACTION W/ INTRAOCULAR LENS IMPLANT Left 04/15/2015: CATARACT EXTRACTION W/PHACO Right     Comment: Procedure: CATARACT EXTRACTION PHACO AND               INTRAOCULAR LENS PLACEMENT (IOC);  Surgeon:   Estill Cotta, MD;  Location: [facility] ORS;                Service: Ophthalmology;  Laterality: Right;  [place]              01:53 AP% 26.2 CDE 54.15 fluid pack lot #               [id number] 2010: COLONOSCOPY     Comment: Dr. Vira Agar No date: EYE SURGERY 1987: MASTECTOMY Right     Comment: with chemo tx 10/2014: RETINAL DETACHMENT SURGERY 2010,2012: SKIN CANCER EXCISION     Comment: face x 4 1962: TONSILLECTOMY  BMI    Body Mass Index:  21.79 kg/m      Reproductive/Obstetrics negative OB ROS                            Anesthesia Physical Anesthesia Plan  ASA: III  Anesthesia Plan: General   Post-op Pain Management:    Induction:   Airway Management Planned:   Additional Equipment:   Intra-op Plan:   Post-operative Plan:   Informed Consent: I have reviewed the patients History and Physical, chart, labs and discussed the procedure including the risks, benefits and alternatives for the proposed anesthesia with the patient or authorized representative who has indicated his/her understanding and acceptance.   Dental Advisory Given  Plan Discussed with: Anesthesiologist, CRNA and Surgeon  Anesthesia Plan Comments: (Per AAAAI recommendations, plan to proceed with case utilizing propofol as patient endorses no prior reactions to propofol. )  Anesthesia Quick Evaluation  

## 2016-04-17 NOTE — H&P (Signed)
Primary Care Physician:  Adin Hector, MD Primary Gastroenterologist:  Dr. Vira Agar  Pre-Procedure History & Physical: HPI:  Marilyn Coleman is a 71 y.o. female is here for an colonoscopy.   Past Medical History:  Diagnosis Date  . Allergy   . 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 Cancer [facility]) 1987   right breast. mastectomy  . Detached retina   . Iritis 2005  . Personal history of malignant neoplasm of breast 1987   right mastectomy  . Personal history of tobacco use, presenting hazards to health   . Personal history of tobacco use, presenting hazards to health 08/02/2015  . Special screening for malignant neoplasms, colon   . Tobacco abuse 08/08/2014    Past Surgical History:  Procedure Laterality Date  . BREAST SURGERY Right    mastectomy  . CATARACT EXTRACTION W/ INTRAOCULAR LENS IMPLANT Left   . CATARACT EXTRACTION W/PHACO Right 04/15/2015   Procedure: CATARACT EXTRACTION PHACO AND INTRAOCULAR LENS PLACEMENT (IOC);  Surgeon: Estill Cotta, MD;  Location: [facility] ORS;  Service: Ophthalmology;  Laterality: Right;  [place] 01:53 AP% 26.2 CDE 54.15 fluid pack lot # [id number]  . COLONOSCOPY  2010   Dr. Vira Agar  . EYE SURGERY    . MASTECTOMY Right 1987   with chemo tx  . RETINAL DETACHMENT SURGERY  10/2014  . SKIN CANCER EXCISION  2010,2012   face x 4  . TONSILLECTOMY  1962    Prior to Admission medications   Medication Sig Start Date End Date Taking? Authorizing Provider  Alpha-Lipoic Acid 50 MG CAPS    Yes Historical Provider, MD  aspirin (GOODSENSE ASPIRIN) 325 MG tablet Take by mouth.   Yes Historical Provider, MD  loratadine (CLARITIN) 10 MG tablet Take 10 mg by mouth.   Yes Historical Provider, MD  Milk Thistle 250 MG CAPS Take 1 capsule by mouth daily.   Yes Historical Provider, MD  Multiple Vitamin (MULTI-VITAMINS) TABS Take by mouth.   Yes Historical Provider, MD  Multiple Vitamins-Minerals (WOMENS BONE HEALTH PO) Take 1 tablet by mouth daily.   Yes Historical Provider,  MD  ASTRAGALUS PO Take by mouth.    Historical Provider, MD  Calcium Citrate 250 MG TABS     Historical Provider, MD  CHROMIUM PO     Historical Provider, MD  Co-Enzyme Q-10 30 MG CAPS     Historical Provider, MD  estradiol (ESTRACE) 0.1 MG/GM vaginal cream Place 1 Applicatorful vaginally once a week.    Historical Provider, MD  GRAPE SEED EXTRACT PO Take 1 tablet by mouth daily.    Historical Provider, MD  Lecithin 1200 MG CAPS Take 1 capsule by mouth daily.    Historical Provider, MD  selenium 50 MCG TABS tablet     Historical Provider, MD  vitamin C (ASCORBIC ACID) 250 MG tablet Take 750 mg by mouth daily.    Historical Provider, MD  Zinc 30 MG TABS Take 1 tablet by mouth daily.    Historical Provider, MD    Allergies as of 04/02/2016 - Review Complete 03/03/2016  Allergen Reaction Noted  . Acrylic polymer [carbomer] Itching 08/09/2012  . Eggs or egg-derived products Other (See Comments) 08/09/2012    Family History  Problem Relation Age of Onset  . Cancer Other     breast cancer FH  . Breast cancer Mother   . Breast cancer Paternal Grandmother   . Diabetes Neg Hx   . Heart disease Neg Hx   .  Colon cancer Neg Hx   . Ovarian cancer Neg Hx     Social History   Social History  . Marital status: Married    Spouse name: N/A  . Number of children: N/A  . Years of education: N/A   Occupational History  . Not on file.   Social History Main Topics  . Smoking status: Current Every Day Smoker    Packs/day: 1.00    Years: 30.00    Types: Cigarettes  . Smokeless tobacco: Never Used  . Alcohol use 0.0 oz/week     Comment: ocassionally  . Drug use: No  . Sexual activity: Yes    Birth control/ protection: Post-menopausal   Other Topics Concern  . Not on file   Social History Narrative  . No narrative on file    Review of Systems: See HPI, otherwise negative ROS  Physical Exam: BP 118/79   Pulse 97   Temp 98.2 F (36.8 C) (Tympanic)   Resp 18   Ht 5\' 6"  (1.676  m)   Wt 61.2 kg (135 lb)   SpO2 99%   BMI 21.79 kg/m  General:   Alert,  pleasant and cooperative in NAD Head:  Normocephalic and atraumatic. Neck:  Supple; no masses or thyromegaly. Lungs:  Clear throughout to auscultation.    Heart:  Regular rate and rhythm. Abdomen:  Soft, nontender and nondistended. Normal bowel sounds, without guarding, and without rebound.   Neurologic:  Alert and  oriented x4;  grossly normal neurologically.  Impression/Plan: [name] is here for an colonoscopy to be performed for [facility] colon polyps  Risks, benefits, limitations, and alternatives regarding  colonoscopy have been reviewed with the patient.  Questions have been answered.  All parties agreeable.   Gaylyn Cheers, MD  04/17/2016, 7:34 AM

## 2016-04-20 ENCOUNTER — Encounter: Payer: Self-pay | Admitting: Unknown Physician Specialty

## 2016-07-16 ENCOUNTER — Other Ambulatory Visit: Payer: Self-pay | Admitting: Internal Medicine

## 2016-07-16 DIAGNOSIS — Z1231 Encounter for screening mammogram for malignant neoplasm of breast: Secondary | ICD-10-CM

## 2016-08-05 ENCOUNTER — Telehealth: Payer: Self-pay

## 2016-08-05 ENCOUNTER — Telehealth: Payer: Self-pay | Admitting: *Deleted

## 2016-08-05 DIAGNOSIS — Z87891 Personal history of nicotine dependence: Secondary | ICD-10-CM

## 2016-08-05 NOTE — Telephone Encounter (Signed)
Notified patient that annual lung cancer screening low dose CT scan is due currently or will be in near future. Confirmed that patient is within the age range of 55-77, and asymptomatic, (no signs or symptoms of lung cancer). Patient denies illness that would prevent curative treatment for lung cancer if found. Verified smoking history, (current, 61 pack year). The shared decision making visit was done 08/10/14. Patient is agreeable for CT scan being scheduled.

## 2016-08-05 NOTE — Telephone Encounter (Signed)
Spoke with patient.  Appt details and address given. 5/29 @ 3:00 pm with 2:45 arrival.  [place].

## 2016-08-25 ENCOUNTER — Ambulatory Visit: Payer: Medicare Other

## 2016-08-25 ENCOUNTER — Ambulatory Visit
Admission: RE | Admit: 2016-08-25 | Discharge: 2016-08-25 | Disposition: A | Discharge status: Home / Self Care | Payer: Medicare Other | Source: Ambulatory Visit | Attending: Oncology | Admitting: Oncology

## 2016-08-25 DIAGNOSIS — Z87891 Personal history of nicotine dependence: Secondary | ICD-10-CM | POA: Insufficient documentation

## 2016-08-25 DIAGNOSIS — Z122 Encounter for screening for malignant neoplasm of respiratory organs: Secondary | ICD-10-CM | POA: Diagnosis not present

## 2016-08-27 ENCOUNTER — Encounter: Payer: Self-pay | Admitting: *Deleted

## 2016-12-31 ENCOUNTER — Ambulatory Visit
Admission: RE | Admit: 2016-12-31 | Discharge: 2016-12-31 | Disposition: A | Discharge status: Home / Self Care | Payer: Medicare Other | Source: Ambulatory Visit | Attending: Internal Medicine | Admitting: Internal Medicine

## 2016-12-31 ENCOUNTER — Ambulatory Visit: Payer: Medicare Other

## 2016-12-31 DIAGNOSIS — Z1231 Encounter for screening mammogram for malignant neoplasm of breast: Secondary | ICD-10-CM | POA: Insufficient documentation

## 2017-01-06 ENCOUNTER — Encounter: Payer: Self-pay | Admitting: General Surgery

## 2017-01-06 ENCOUNTER — Ambulatory Visit (INDEPENDENT_AMBULATORY_CARE_PROVIDER_SITE_OTHER): Payer: Medicare Other | Admitting: General Surgery

## 2017-01-06 VITALS — BP 118/72 | HR 92 | Resp 12 | Ht 66.5 in | Wt 137.0 lb

## 2017-01-06 DIAGNOSIS — Z853 Personal history of malignant neoplasm of breast: Secondary | ICD-10-CM | POA: Diagnosis not present

## 2017-01-06 NOTE — Progress Notes (Signed)
Patient ID: Marilyn Coleman, female   DOB: 1945/10/26, 71 y.o.   MRN: [id number]  Chief Complaint  Patient presents with  . Follow-up    Marilyn Coleman Marilyn Coleman is a 71 y.o. female.  who presents for her follow up breast cancer and a breast evaluation. The most recent left mammogram was done on 12-31-16.  Patient does perform regular self breast checks and gets regular mammograms done.   No new breast issues.  Marilyn Coleman  Past Medical History:  Diagnosis Date  . Allergy   . Cancer ([place]) 2012   face x 4  . Cancer [facility]) 1987   right breast. mastectomy  . Detached retina   . Iritis 2005  . Personal history of malignant neoplasm of breast 1987   right mastectomy  . Personal history of tobacco use, presenting hazards to health   . Personal history of tobacco use, presenting hazards to health 08/02/2015  . Special screening for malignant neoplasms, colon   . Tobacco abuse 08/08/2014    Past Surgical History:  Procedure Laterality Date  . BREAST SURGERY Right    mastectomy  . CATARACT EXTRACTION W/ INTRAOCULAR LENS IMPLANT Left   . CATARACT EXTRACTION W/PHACO Right 04/15/2015   Procedure: CATARACT EXTRACTION PHACO AND INTRAOCULAR LENS PLACEMENT (IOC);  Surgeon: Estill Cotta, MD;  Location: [facility] ORS;  Service: Ophthalmology;  Laterality: Right;  [place] 01:53 AP% 26.2 CDE 54.15 fluid pack lot # [id number]  . COLONOSCOPY  2010   Dr. Vira Agar  . COLONOSCOPY WITH PROPOFOL N/A 04/17/2016   Procedure: COLONOSCOPY WITH PROPOFOL;  Surgeon: Manya Silvas, MD;  Location: [facility] ENDOSCOPY;  Service: Endoscopy;  Laterality: N/A;  . EYE SURGERY    . MASTECTOMY Right 1987   with chemo tx  . RETINAL DETACHMENT SURGERY  10/2014  . SKIN CANCER EXCISION  2010,2012   face x 4  . TONSILLECTOMY  1962    Family History  Problem Relation Age of Onset  . Cancer Other        breast cancer FH  . Breast cancer Mother   . Breast cancer Paternal Grandmother   . Lung cancer Brother 17  . Diabetes Neg Hx   . Heart  disease Neg Hx   . Colon cancer Neg Hx   . Ovarian cancer Neg Hx     Social History Social History  Substance Use Topics  . Smoking status: Current Every Day Smoker    Packs/day: 1.00    Years: 30.00    Types: Cigarettes  . Smokeless tobacco: Never Used  . Alcohol use 0.0 oz/week     Comment: ocassionally    Allergies  Allergen Reactions  . Acrylic Polymer [Carbomer] Itching    Allergy is to acrylic acid  . Eggs Or Egg-Derived Products Other (See Comments)    Joint pain    Current Outpatient Prescriptions  Medication Sig Dispense Refill  . Alpha-Lipoic Acid 50 MG CAPS     . aspirin (GOODSENSE ASPIRIN) 325 MG tablet Take by mouth.    . ASTRAGALUS PO Take by mouth.    . Calcium Citrate 250 MG TABS     . CHROMIUM PO     . Co-Enzyme Q-10 30 MG CAPS     . estradiol (ESTRACE) 0.1 MG/GM vaginal cream Place 1 Applicatorful vaginally once a week.    Marland Kitchen GRAPE SEED EXTRACT PO Take 1 tablet by mouth daily.    . Lecithin 1200 MG CAPS Take 1 capsule by mouth daily.    Marland Kitchen  Milk Thistle 250 MG CAPS Take 1 capsule by mouth daily.    . Multiple Vitamin (MULTI-VITAMINS) TABS Take by mouth.    . Multiple Vitamins-Minerals (WOMENS BONE HEALTH PO) Take 1 tablet by mouth daily.    Marland Kitchen selenium 50 MCG TABS tablet     . vitamin C (ASCORBIC ACID) 250 MG tablet Take 750 mg by mouth daily.    . Zinc 30 MG TABS Take 1 tablet by mouth daily.     No current facility-administered medications for this visit.     Review of Systems Review of Systems  Constitutional: Negative.   Respiratory: Negative.   Cardiovascular: Negative.     Blood pressure 118/72, pulse 92, resp. rate 12, height 5' 6.5" (1.689 m), weight 137 lb (62.1 kg).  Physical Exam Physical Exam  Constitutional: She is oriented to person, place, and time. She appears well-developed and well-nourished.  HENT:  Mouth/Throat: Oropharynx is clear and moist.  Eyes: Conjunctivae are normal. No scleral icterus.  Neck: Neck supple.   Cardiovascular: Normal rate, regular rhythm and normal heart sounds.   Pulmonary/Chest: Effort normal and breath sounds normal. No respiratory distress. Left breast exhibits no inverted nipple, no mass, no nipple discharge, no skin change and no tenderness.  Right mastectomy site is well healed, no signs of local recurrence   Lymphadenopathy:    She has no cervical adenopathy.    She has no axillary adenopathy.  Neurological: She is alert and oriented to person, place, and time.  Skin: Skin is warm and dry.  Psychiatric: Her behavior is normal.    Data Reviewed  Mammogram reviewed and stable.   Assessment    History of right breast cancer. Stable physical exam.    Plan    Patient will be asked to follow up with her GYN, Dr Enzo Bi, in one year with a left screening mammogram. The patient is aware to call back for any questions or new concerns.       Marilyn Coleman, Physical Exam, Assessment and Plan have been scribed under the direction and in the presence of Marilyn Jewel, MD Karie Fetch, RN I have completed the exam and reviewed the above documentation for accuracy and completeness.  I agree with the above.  Haematologist has been used and any errors in dictation or transcription are unintentional.  Seeplaputhur G. Jamal Collin, M.D., F.A.C.S.   Junie Panning G 01/06/2017, 7:43 PM

## 2017-01-06 NOTE — Patient Instructions (Signed)
Patient will be asked to follow up with her GYN, Dr Enzo Bi, in one year with a left screening mammogram.

## 2017-01-29 ENCOUNTER — Encounter: Payer: Self-pay | Admitting: Obstetrics and Gynecology

## 2017-03-04 ENCOUNTER — Encounter: Payer: Medicare Other | Admitting: Obstetrics and Gynecology

## 2017-04-05 NOTE — Progress Notes (Signed)
Patient ID: Marilyn Coleman, female   DOB: 02/12/1946, 72 y.o.   MRN: [id number] ANNUAL PREVENTATIVE CARE GYN  ENCOUNTER NOTE  Subjective:       Marilyn Coleman is a 72 y.o. G2P2 female here for a routine annual gynecologic exam.  Current complaints:  1.  Medicare breast and pelvic  2.  History of breast cancer, followed by Dr. Jamal Collin; with Dr. Lindajo Royal retirement, the patient is to do her breast cancer surveillance with me. 3.  History of osteoporosis; Taking calcium with vitamin D 4.  Vaginal atrophy; using Estrace cream intravaginal approximately once a week  Patient has had colonoscopy in 2018-normal; stool guaiac testing can be deferred this year The patient continues to smoke cigarettes.   Gynecologic History No LMP recorded. Patient is postmenopausal. Contraception: post menopausal status Last Pap: 03/03/2016 neg. Results were: normal Last mammogram: 12/2016 birad-1 thru Dr. Jamal Collin . Results were: normal History of osteoporosis on DEXA scan in 2012  Obstetric History OB History  Gravida Para Term Preterm AB Living  2 2 2     2   SAB TAB Ectopic Multiple Live Births          2    # Outcome Date GA Lbr Len/2nd Weight Sex Delivery Anes PTL Lv  2 Term 1977   7 lb 1.6 oz (3.221 kg) M Vag-Spont   LIV  1 Term 1971   7 lb 1.6 oz (3.221 kg) F Vag-Spont   LIV    Obstetric Comments  Age with first menstruation-11  Age with first pregnancy-24  LMP-1988    Past Medical History:  Diagnosis Date  . Allergy   . Cancer ([place]) 2012   face x 4  . Cancer [facility]) 1987   right breast. mastectomy  . Detached retina   . Iritis 2005  . Personal history of malignant neoplasm of breast 1987   right mastectomy  . Personal history of tobacco use, presenting hazards to health   . Personal history of tobacco use, presenting hazards to health 08/02/2015  . Special screening for malignant neoplasms, colon   . Tobacco abuse 08/08/2014    Past Surgical History:  Procedure Laterality Date  . BREAST  SURGERY Right    mastectomy  . CATARACT EXTRACTION W/ INTRAOCULAR LENS IMPLANT Left   . CATARACT EXTRACTION W/PHACO Right 04/15/2015   Procedure: CATARACT EXTRACTION PHACO AND INTRAOCULAR LENS PLACEMENT (IOC);  Surgeon: Estill Cotta, MD;  Location: [facility] ORS;  Service: Ophthalmology;  Laterality: Right;  [place] 01:53 AP% 26.2 CDE 54.15 fluid pack lot # [id number]  . COLONOSCOPY  2010   Dr. Vira Agar  . COLONOSCOPY WITH PROPOFOL N/A 04/17/2016   Procedure: COLONOSCOPY WITH PROPOFOL;  Surgeon: Manya Silvas, MD;  Location: [facility] ENDOSCOPY;  Service: Endoscopy;  Laterality: N/A;  . EYE SURGERY    . MASTECTOMY Right 1987   with chemo tx  . RETINAL DETACHMENT SURGERY  10/2014  . SKIN CANCER EXCISION  2010,2012   face x 4  . TONSILLECTOMY  1962    Current Outpatient Medications on File Prior to Visit  Medication Sig Dispense Refill  . Alpha-Lipoic Acid 50 MG CAPS     . aspirin (GOODSENSE ASPIRIN) 325 MG tablet Take by mouth.    . ASTRAGALUS PO Take by mouth.    . Calcium Citrate 250 MG TABS     . CHROMIUM PO     . Co-Enzyme Q-10 30 MG CAPS     . estradiol (ESTRACE) 0.1 MG/GM vaginal cream  Place 1 Applicatorful vaginally once a week.    Marland Kitchen GRAPE SEED EXTRACT PO Take 1 tablet by mouth daily.    . Lecithin 1200 MG CAPS Take 1 capsule by mouth daily.    . Milk Thistle 250 MG CAPS Take 1 capsule by mouth daily.    . Multiple Vitamin (MULTI-VITAMINS) TABS Take by mouth.    . Multiple Vitamins-Minerals (WOMENS BONE HEALTH PO) Take 1 tablet by mouth daily.    Marland Kitchen selenium 50 MCG TABS tablet     . vitamin C (ASCORBIC ACID) 250 MG tablet Take 750 mg by mouth daily.    . Zinc 30 MG TABS Take 1 tablet by mouth daily.     No current facility-administered medications on file prior to visit.     Allergies  Allergen Reactions  . Acrylic Polymer [Carbomer] Itching    Allergy is to acrylic acid  . Eggs Or Egg-Derived Products Other (See Comments)    Joint pain    Social History   Socioeconomic  History  . Marital status: Married    Spouse name: Not on file  . Number of children: Not on file  . Years of education: Not on file  . Highest education level: Not on file  Social Needs  . Financial resource strain: Not on file  . Food insecurity - worry: Not on file  . Food insecurity - inability: Not on file  . Transportation needs - medical: Not on file  . Transportation needs - non-medical: Not on file  Occupational History  . Not on file  Tobacco Use  . Smoking status: Current Every Day Smoker    Packs/day: 1.00    Years: 30.00    Pack years: 30.00    Types: Cigarettes  . Smokeless tobacco: Never Used  Substance and Sexual Activity  . Alcohol use: Yes    Alcohol/week: 0.0 oz    Comment: ocassionally  . Drug use: No  . Sexual activity: Yes    Birth control/protection: Post-menopausal  Other Topics Concern  . Not on file  Social History Narrative  . Not on file    Family History  Problem Relation Age of Onset  . Cancer Other        breast cancer FH  . Breast cancer Mother   . Breast cancer Paternal Grandmother   . Lung cancer Brother 7  . Diabetes Neg Hx   . Heart disease Neg Hx   . Colon cancer Neg Hx   . Ovarian cancer Neg Hx     The following portions of the patient's history were reviewed and updated as appropriate: allergies, current medications, past family history, past medical history, past social history, past surgical history and problem list.  Review of Systems Review of Systems  Constitutional: Negative.   HENT: Negative.   Eyes: Negative.   Respiratory: Negative.   Cardiovascular: Negative.   Gastrointestinal: Negative.   Genitourinary: Negative.   Musculoskeletal: Negative.   Skin: Negative.   Neurological: Negative.   Endo/Heme/Allergies: Negative.   Psychiatric/Behavioral: Negative.      Objective:   BP 133/79   Pulse (!) 120   Ht 5' 6.5" (1.689 m)   Wt 132 lb 9.6 oz (60.1 kg)   BMI 21.08 kg/m  CONSTITUTIONAL:  Well-developed, well-nourished female in no acute distress.  PSYCHIATRIC: Normal mood and affect. Normal behavior. Normal judgment and thought content. [place]: Alert and oriented to person, place, and time. Normal muscle tone coordination. No cranial nerve deficit noted. HENT:  Normocephalic, atraumatic, External right and left ear normal. Oropharynx is clear and moist EYES: Conjunctivae and EOM are normal. No scleral icterus.  NECK: Normal range of motion, supple, no masses.  Normal thyroid.  SKIN: Skin is warm and dry. No rash noted. Not diaphoretic. No erythema. No pallor. CARDIOVASCULAR: Normal heart rate noted, regular rhythm, no murmur. RESPIRATORY: Clear to auscultation bilaterally. Effort and breath sounds normal, no problems with respiration noted. BREASTS: Right mastectomy scar, healed; no tenderness or mass palpated left breast without mass, skin changes, nipple drainage, or lymphadenopathy. ABDOMEN: Soft, normal bowel sounds, no distention noted.  No tenderness, rebound or guarding.  BLADDER: Normal PELVIC:  External Genitalia: Normal  BUS: Normal  Vagina: Normal; minimal vaginal atrophy; single digit exam-no palpable tenderness or masses  Cervix: Normal; no lesions; no cervical motion tenderness  Uterus: Normal; Midplane, nontender, mobile  Adnexa: Normal; Nonpalpable and nontender  RV: External Exam NormaI, No Rectal Masses and Normal Sphincter tone  MUSCULOSKELETAL: Normal range of motion. No tenderness.  No cyanosis, clubbing, or edema.  2+ distal pulses. LYMPHATIC: No Axillary, Supraclavicular, or Inguinal Adenopathy.    Assessment:   Annual gynecologic examination 72 y.o. Contraception: post menopausal status Normal BMI History of breast cancer, no evidence of disease. Osteoporosis. Vaginal atrophy Tobacco user  Plan:  Pap: Not needed Mammogram: ordered Stool Guaiac Testing:  Not needed-colonoscopy 04/07/2016-wnl Labs: thru pcp Routine preventative health  maintenance measures emphasized: Exercise/Diet/Weight control, Tobacco Warnings and Alcohol/Substance use risks Continue calcium with vitamin D supplementation Continue with Estrace cream 1 g intravaginal once or twice a week Return to [place], CMA  Brayton Mars, MD    Note: This dictation was prepared with Dragon dictation along with smaller phrase technology. Any transcriptional errors that result from this process are unintentional.

## 2017-04-07 ENCOUNTER — Encounter: Payer: Self-pay | Admitting: Obstetrics and Gynecology

## 2017-04-07 ENCOUNTER — Ambulatory Visit (INDEPENDENT_AMBULATORY_CARE_PROVIDER_SITE_OTHER): Payer: Medicare Other | Admitting: Obstetrics and Gynecology

## 2017-04-07 VITALS — BP 133/79 | HR 120 | Ht 66.5 in | Wt 132.6 lb

## 2017-04-07 DIAGNOSIS — Z853 Personal history of malignant neoplasm of breast: Secondary | ICD-10-CM

## 2017-04-07 DIAGNOSIS — Z9189 Other specified personal risk factors, not elsewhere classified: Secondary | ICD-10-CM | POA: Diagnosis not present

## 2017-04-07 DIAGNOSIS — Z01419 Encounter for gynecological examination (general) (routine) without abnormal findings: Secondary | ICD-10-CM | POA: Diagnosis not present

## 2017-04-07 DIAGNOSIS — N952 Postmenopausal atrophic vaginitis: Secondary | ICD-10-CM

## 2017-04-07 DIAGNOSIS — Z780 Asymptomatic menopausal state: Secondary | ICD-10-CM

## 2017-04-07 DIAGNOSIS — M810 Age-related osteoporosis without current pathological fracture: Secondary | ICD-10-CM | POA: Diagnosis not present

## 2017-04-07 NOTE — Patient Instructions (Signed)
1.  No Pap smear is needed. 2.  Mammogram already obtained this year 3.  Stool guaiac testing not ordered due to colonoscopy within the last year being normal 4.  Screening labs are to be obtained through Dr. Effie Shy 5.  Continue with calcium and vitamin D supplementation 6.  Continue with Estrace cream intravaginal 1 g once or twice a week 7.  Lubricants to consider:  Virgin olive oil  Coconut oil  Astroglide  JO H20 Lubricant 8.  Return in 1 year for annual exam   Health Maintenance for Postmenopausal Women Menopause is a normal process in which your reproductive ability comes to an end. This process happens gradually over a span of months to years, usually between the ages of 96 and 60. Menopause is complete when you have missed 12 consecutive menstrual periods. It is important to talk with your health care provider about some of the most common conditions that affect postmenopausal women, such as heart disease, cancer, and bone loss (osteoporosis). Adopting a healthy lifestyle and getting preventive care can help to promote your health and wellness. Those actions can also lower your chances of developing some of these common conditions. What should I know about menopause? During menopause, you may experience a number of symptoms, such as:  Moderate-to-severe hot flashes.  Night sweats.  Decrease in sex drive.  Mood swings.  Headaches.  Tiredness.  Irritability.  Memory problems.  Insomnia.  Choosing to treat or not to treat menopausal changes is an individual decision that you make with your health care provider. What should I know about hormone replacement therapy and supplements? Hormone therapy products are effective for treating symptoms that are associated with menopause, such as hot flashes and night sweats. Hormone replacement carries certain risks, especially as you become older. If you are thinking about using estrogen or estrogen with progestin treatments,  discuss the benefits and risks with your health care provider. What should I know about heart disease and stroke? Heart disease, heart attack, and stroke become more likely as you age. This may be due, in part, to the hormonal changes that your body experiences during menopause. These can affect how your body processes dietary fats, triglycerides, and cholesterol. Heart attack and stroke are both medical emergencies. There are many things that you can do to help prevent heart disease and stroke:  Have your blood pressure checked at least every 1-2 years. High blood pressure causes heart disease and increases the risk of stroke.  If you are 78-63 years old, ask your health care provider if you should take aspirin to prevent a heart attack or a stroke.  Do not use any tobacco products, including cigarettes, chewing tobacco, or electronic cigarettes. If you need help quitting, ask your health care provider.  It is important to eat a healthy diet and maintain a healthy weight. ? Be sure to include plenty of vegetables, fruits, low-fat dairy products, and lean protein. ? Avoid eating foods that are high in solid fats, added sugars, or salt (sodium).  Get regular exercise. This is one of the most important things that you can do for your health. ? Try to exercise for at least 150 minutes each week. The type of exercise that you do should increase your heart rate and make you sweat. This is known as moderate-intensity exercise. ? Try to do strengthening exercises at least twice each week. Do these in addition to the moderate-intensity exercise.  Know your numbers.Ask your health care provider to check  your cholesterol and your blood glucose. Continue to have your blood tested as directed by your health care provider.  What should I know about cancer screening? There are several types of cancer. Take the following steps to reduce your risk and to catch any cancer development as early as  possible. Breast Cancer  Practice breast self-awareness. ? This means understanding how your breasts normally appear and feel. ? It also means doing regular breast self-exams. Let your health care provider know about any changes, no matter how small.  If you are 44 or older, have a clinician do a breast exam (clinical breast exam or CBE) every year. Depending on your age, family history, and medical history, it may be recommended that you also have a yearly breast X-ray (mammogram).  If you have a family history of breast cancer, talk with your health care provider about genetic screening.  If you are at high risk for breast cancer, talk with your health care provider about having an MRI and a mammogram every year.  Breast cancer (BRCA) gene test is recommended for women who have family members with BRCA-related cancers. Results of the assessment will determine the need for genetic counseling and BRCA1 and for BRCA2 testing. BRCA-related cancers include these types: ? Breast. This occurs in males or females. ? Ovarian. ? Tubal. This may also be called fallopian tube cancer. ? Cancer of the abdominal or pelvic lining (peritoneal cancer). ? Prostate. ? Pancreatic.  Cervical, Uterine, and Ovarian Cancer Your health care provider may recommend that you be screened regularly for cancer of the pelvic organs. These include your ovaries, uterus, and vagina. This screening involves a pelvic exam, which includes checking for microscopic changes to the surface of your cervix (Pap test).  For women ages 21-65, health care providers may recommend a pelvic exam and a Pap test every three years. For women ages 30-65, they may recommend the Pap test and pelvic exam, combined with testing for human papilloma virus (HPV), every five years. Some types of HPV increase your risk of cervical cancer. Testing for HPV may also be done on women of any age who have unclear Pap test results.  Other health care  providers may not recommend any screening for nonpregnant women who are considered low risk for pelvic cancer and have no symptoms. Ask your health care provider if a screening pelvic exam is right for you.  If you have had past treatment for cervical cancer or a condition that could lead to cancer, you need Pap tests and screening for cancer for at least 20 years after your treatment. If Pap tests have been discontinued for you, your risk factors (such as having a new sexual partner) need to be reassessed to determine if you should start having screenings again. Some women have medical problems that increase the chance of getting cervical cancer. In these cases, your health care provider may recommend that you have screening and Pap tests more often.  If you have a family history of uterine cancer or ovarian cancer, talk with your health care provider about genetic screening.  If you have vaginal bleeding after reaching menopause, tell your health care provider.  There are currently no reliable tests available to screen for ovarian cancer.  Lung Cancer Lung cancer screening is recommended for adults 37-76 years old who are at high risk for lung cancer because of a history of smoking. A yearly low-dose CT scan of the lungs is recommended if you:  Currently smoke.  Have a history of at least 30 pack-years of smoking and you currently smoke or have quit within the past 15 years. A pack-year is smoking an average of one pack of cigarettes per day for one year.  Yearly screening should:  Continue until it has been 15 years since you quit.  Stop if you develop a health problem that would prevent you from having lung cancer treatment.  Colorectal Cancer  This type of cancer can be detected and can often be prevented.  Routine colorectal cancer screening usually begins at age 71 and continues through age 60.  If you have risk factors for colon cancer, your health care provider may recommend  that you be screened at an earlier age.  If you have a family history of colorectal cancer, talk with your health care provider about genetic screening.  Your health care provider may also recommend using home test kits to check for hidden blood in your stool.  A small camera at the end of a tube can be used to examine your colon directly (sigmoidoscopy or colonoscopy). This is done to check for the earliest forms of colorectal cancer.  Direct examination of the colon should be repeated every 5-10 years until age 22. However, if early forms of precancerous polyps or small growths are found or if you have a family history or genetic risk for colorectal cancer, you may need to be screened more often.  Skin Cancer  Check your skin from head to toe regularly.  Monitor any moles. Be sure to tell your health care provider: ? About any new moles or changes in moles, especially if there is a change in a mole's shape or color. ? If you have a mole that is larger than the size of a pencil eraser.  If any of your family members has a history of skin cancer, especially at a young age, talk with your health care provider about genetic screening.  Always use sunscreen. Apply sunscreen liberally and repeatedly throughout the day.  Whenever you are outside, protect yourself by wearing long sleeves, pants, a wide-brimmed hat, and sunglasses.  What should I know about osteoporosis? Osteoporosis is a condition in which bone destruction happens more quickly than new bone creation. After menopause, you may be at an increased risk for osteoporosis. To help prevent osteoporosis or the bone fractures that can happen because of osteoporosis, the following is recommended:  If you are 77-66 years old, get at least 1,000 mg of calcium and at least 600 mg of vitamin D per day.  If you are older than age 31 but younger than age 67, get at least 1,200 mg of calcium and at least 600 mg of vitamin D per day.  If you  are older than age 96, get at least 1,200 mg of calcium and at least 800 mg of vitamin D per day.  Smoking and excessive alcohol intake increase the risk of osteoporosis. Eat foods that are rich in calcium and vitamin D, and do weight-bearing exercises several times each week as directed by your health care provider. What should I know about how menopause affects my mental health? Depression may occur at any age, but it is more common as you become older. Common symptoms of depression include:  Low or sad mood.  Changes in sleep patterns.  Changes in appetite or eating patterns.  Feeling an overall lack of motivation or enjoyment of activities that you previously enjoyed.  Frequent crying spells.  Talk with your  health care provider if you think that you are experiencing depression. What should I know about immunizations? It is important that you get and maintain your immunizations. These include:  Tetanus, diphtheria, and pertussis (Tdap) booster vaccine.  Influenza every year before the flu season begins.  Pneumonia vaccine.  Shingles vaccine.  Your health care provider may also recommend other immunizations. This information is not intended to replace advice given to you by your health care provider. Make sure you discuss any questions you have with your health care provider. Document Released: 05/08/2005 Document Revised: 10/04/2015 Document Reviewed: 12/18/2014 Elsevier Interactive Patient Education  2018 [employer].

## 2017-07-20 ENCOUNTER — Other Ambulatory Visit: Payer: Self-pay | Admitting: Internal Medicine

## 2017-07-20 DIAGNOSIS — I77810 Thoracic aortic ectasia: Secondary | ICD-10-CM

## 2017-08-04 ENCOUNTER — Ambulatory Visit
Admission: RE | Admit: 2017-08-04 | Discharge: 2017-08-04 | Disposition: A | Discharge status: Home / Self Care | Payer: Medicare Other | Source: Ambulatory Visit | Attending: Internal Medicine | Admitting: Internal Medicine

## 2017-08-04 DIAGNOSIS — I77810 Thoracic aortic ectasia: Secondary | ICD-10-CM | POA: Diagnosis not present

## 2017-08-04 MED ORDER — IOHEXOL 350 MG/ML SOLN
75.0000 mL | Freq: Once | INTRAVENOUS | Status: AC | PRN
  Administered 2017-08-04: 75 mL via INTRAVENOUS

## 2017-08-04 MED ORDER — IOPAMIDOL (ISOVUE-370) INJECTION 76%: 75.0000 mL | Freq: Once | INTRAVENOUS | Status: DC | PRN

## 2018-02-04 ENCOUNTER — Other Ambulatory Visit: Payer: Self-pay | Admitting: Internal Medicine

## 2018-02-04 DIAGNOSIS — Z1231 Encounter for screening mammogram for malignant neoplasm of breast: Secondary | ICD-10-CM

## 2018-03-09 ENCOUNTER — Ambulatory Visit
Admission: RE | Admit: 2018-03-09 | Discharge: 2018-03-09 | Disposition: A | Discharge status: Home / Self Care | Payer: Medicare Other | Source: Ambulatory Visit | Attending: Internal Medicine | Admitting: Internal Medicine

## 2018-03-09 DIAGNOSIS — Z1231 Encounter for screening mammogram for malignant neoplasm of breast: Secondary | ICD-10-CM | POA: Insufficient documentation

## 2018-03-17 IMAGING — MG MM DIGITAL SCREENING UNILAT*L* W/ TOMO W/ CAD
6 series · 6 of 14 positions shown · non-contrast
Comparison: Previous exam(s).

CLINICAL DATA: Screening.

EXAM:
2D DIGITAL SCREENING UNILATERAL LEFT MAMMOGRAM WITH CAD AND ADJUNCT
TOMO

[L MLO]
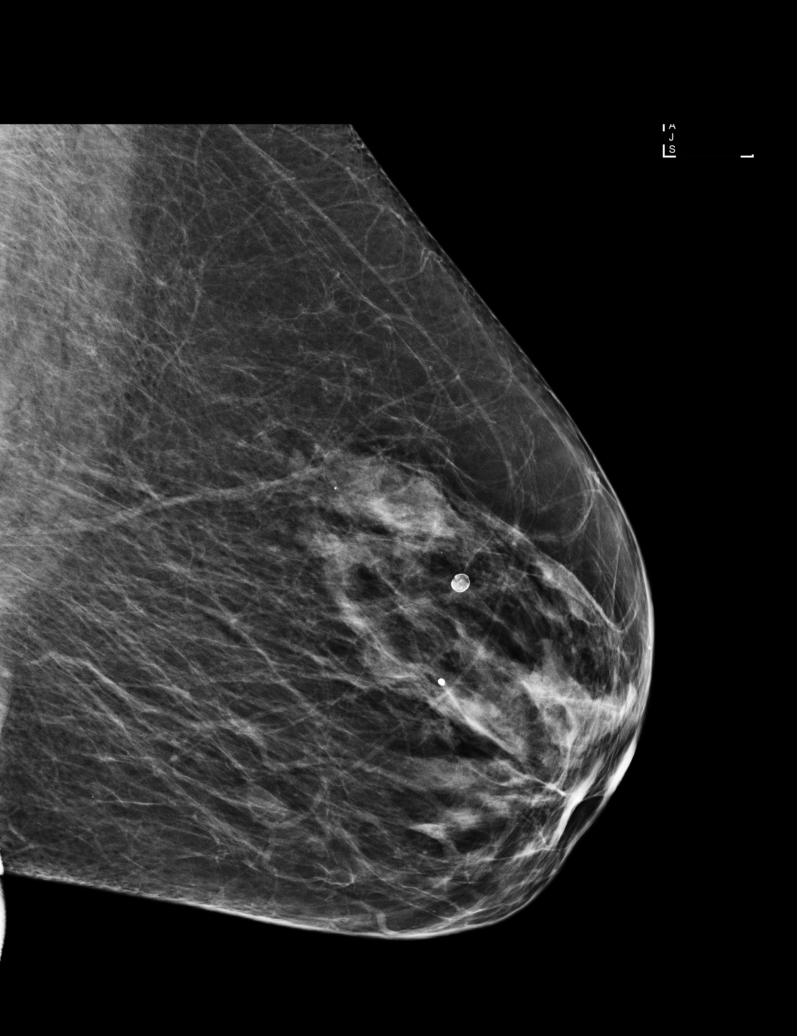

[L MLO synth-2D]
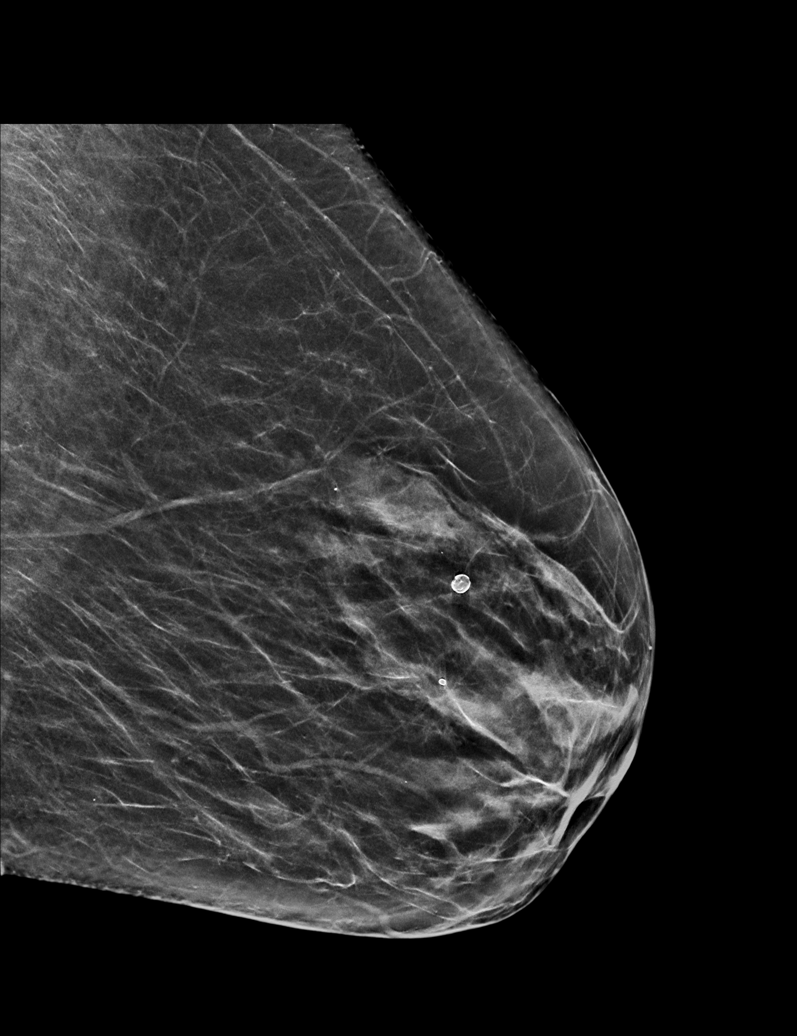

[L CC]
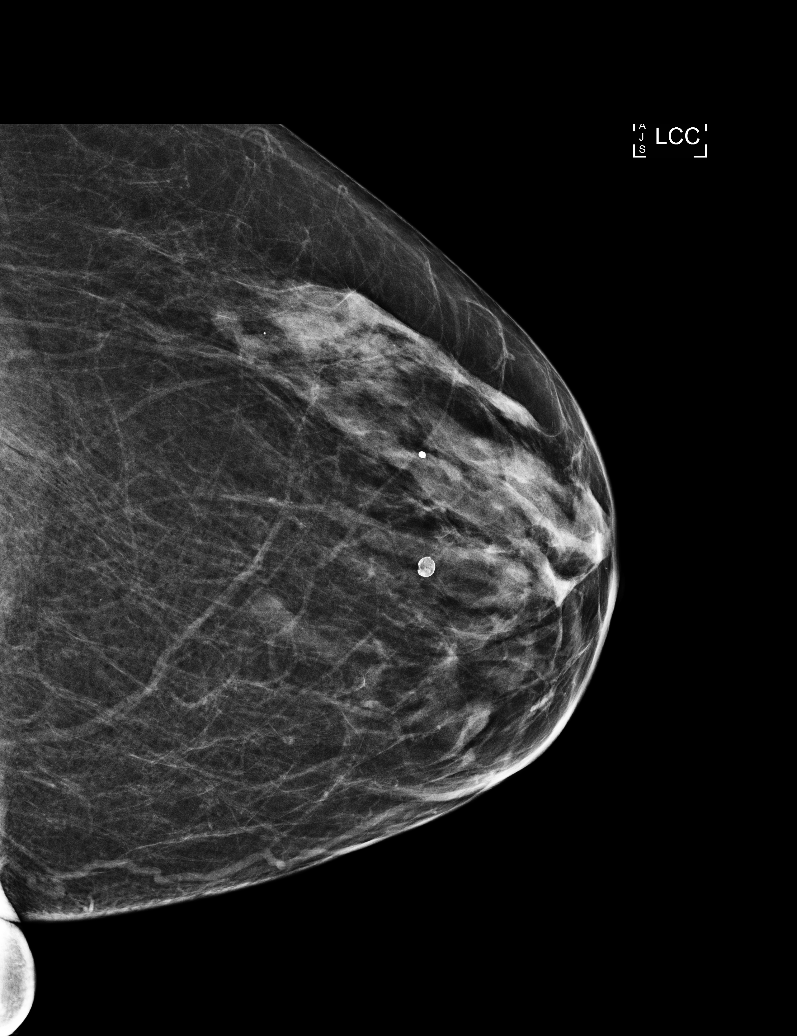

[L CC synth-2D]
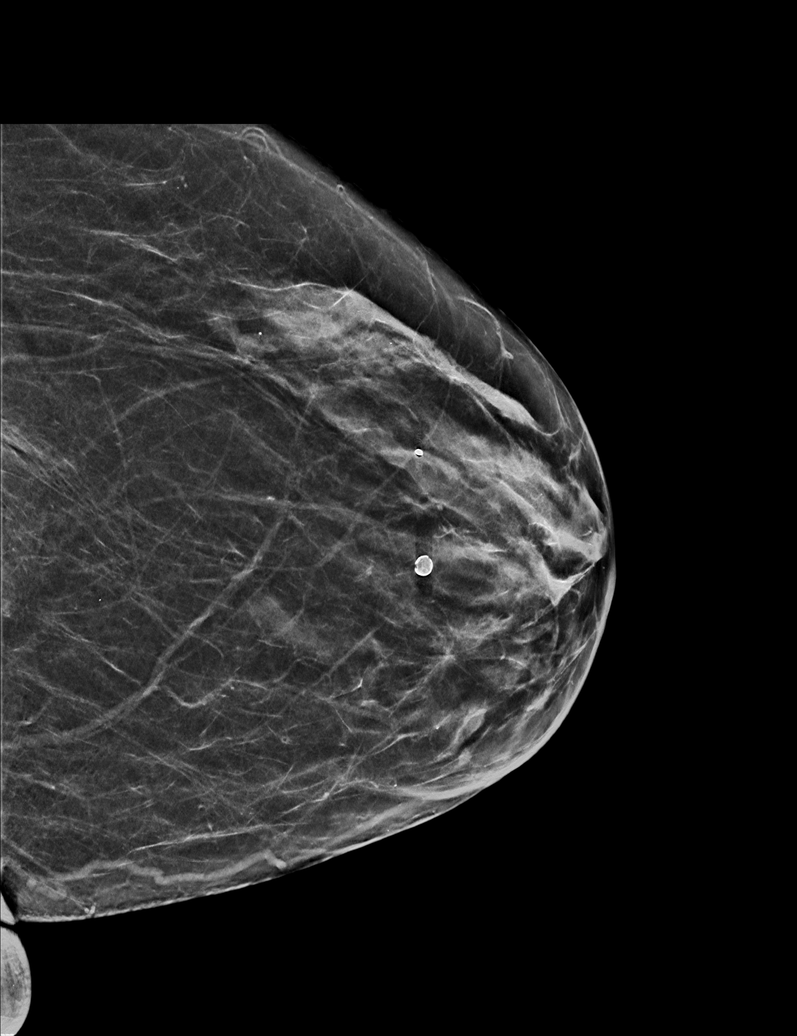

[L CC tomo · tomo slice 27/52.0]
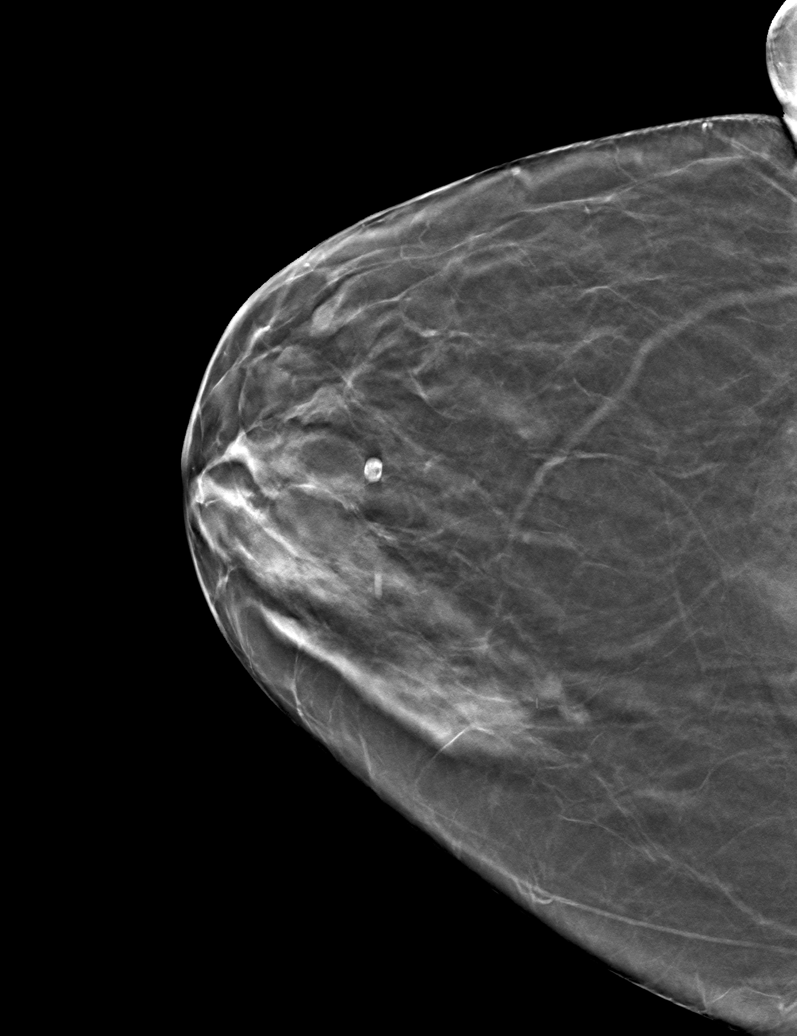

[L MLO tomo · tomo slice 27/54.0]
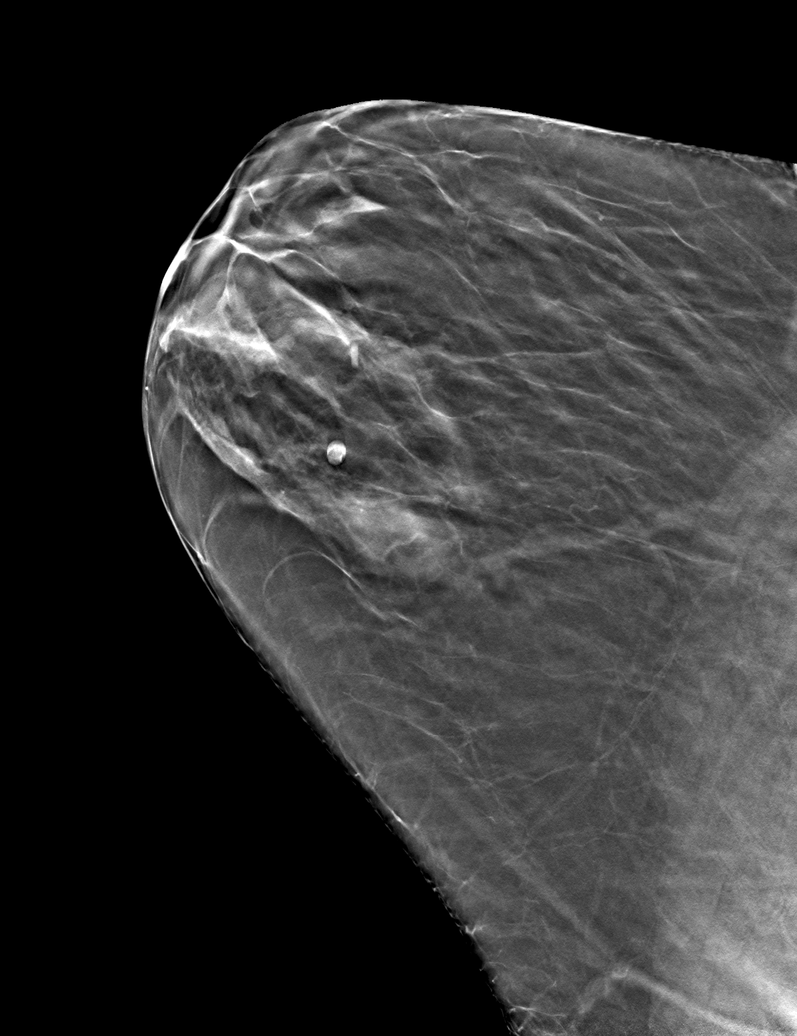

[6 of 14 positions shown; findings below may reference images not displayed]

ACR Breast Density Category b: There are scattered areas of
fibroglandular density.
FINDINGS: In the left breast, a possible mass warrants further evaluation.
Patient is status post right mastectomy.

Images were processed with CAD.
IMPRESSION: Further evaluation is suggested for possible mass in the left
breast.

RECOMMENDATION:
Diagnostic mammogram and possibly ultrasound of the left breast.
(Code:[id number])

The patient will be contacted regarding the findings, and additional
imaging will be scheduled.

BI-RADS CATEGORY  0: Incomplete. Need additional imaging evaluation
and/or prior mammograms for comparison.

## 2018-04-12 ENCOUNTER — Encounter: Payer: Medicare Other | Admitting: Obstetrics and Gynecology

## 2018-04-28 ENCOUNTER — Other Ambulatory Visit (HOSPITAL_COMMUNITY)
Admission: RE | Admit: 2018-04-28 | Discharge: 2018-04-28 | Disposition: A | Discharge status: Home / Self Care | Payer: Medicare Other | Source: Ambulatory Visit | Attending: Obstetrics & Gynecology | Admitting: Obstetrics & Gynecology

## 2018-04-28 ENCOUNTER — Encounter: Payer: Self-pay | Admitting: Obstetrics & Gynecology

## 2018-04-28 ENCOUNTER — Ambulatory Visit (INDEPENDENT_AMBULATORY_CARE_PROVIDER_SITE_OTHER): Payer: Medicare Other | Admitting: Obstetrics & Gynecology

## 2018-04-28 VITALS — BP 130/90 | Ht 66.0 in | Wt 138.0 lb

## 2018-04-28 DIAGNOSIS — Z01419 Encounter for gynecological examination (general) (routine) without abnormal findings: Secondary | ICD-10-CM

## 2018-04-28 DIAGNOSIS — Z124 Encounter for screening for malignant neoplasm of cervix: Secondary | ICD-10-CM | POA: Insufficient documentation

## 2018-04-28 DIAGNOSIS — Z1211 Encounter for screening for malignant neoplasm of colon: Secondary | ICD-10-CM

## 2018-04-28 DIAGNOSIS — M6281 Muscle weakness (generalized): Secondary | ICD-10-CM

## 2018-04-28 DIAGNOSIS — Z853 Personal history of malignant neoplasm of breast: Secondary | ICD-10-CM

## 2018-04-28 DIAGNOSIS — Z1231 Encounter for screening mammogram for malignant neoplasm of breast: Secondary | ICD-10-CM

## 2018-04-28 DIAGNOSIS — Z1239 Encounter for other screening for malignant neoplasm of breast: Secondary | ICD-10-CM

## 2018-04-28 MED ORDER — ESTRADIOL 0.1 MG/GM VA CREA: 1.0000 | TOPICAL_CREAM | VAGINAL | 3 refills | Status: DC

## 2018-04-28 NOTE — Progress Notes (Signed)
HPI:      [name] is a 73 y.o. [id number] who LMP was in the past, she presents today for her annual examination.  The patient has no complaints today. The patient is sexually active. Herlast pap: approximate date 2017 and was normal and last mammogram: approximate date 2019 and was normal and she has h/o breast cancer 30 years ago, s/p right mastectomy and treatment without recurrence.  The patient does perform self breast exams.  There is notable family history of breast or ovarian cancer in her family. The patient is taking hormone replacement therapy, for vaginal atrophy (Estrace once weekly). Patient denies post-menopausal vaginal bleeding.   The patient has regular exercise: yes. The patient denies current symptoms of depression.    GYN Hx: Last Colonoscopy:2 years ago. Normal.  Last DEXA: few years ago.    PMHx: Past Medical History:  Diagnosis Date  . Allergy   . 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 Cancer [facility]) 1987   right breast. mastectomy  . Detached retina   . Iritis 2005  . Personal history of malignant neoplasm of breast 1987   right mastectomy  . Personal history of tobacco use, presenting hazards to health   . Personal history of tobacco use, presenting hazards to health 08/02/2015  . Special screening for malignant neoplasms, colon   . Tobacco abuse 08/08/2014   Past Surgical History:  Procedure Laterality Date  . BREAST SURGERY Right    mastectomy  . CATARACT EXTRACTION W/ INTRAOCULAR LENS IMPLANT Left   . CATARACT EXTRACTION W/PHACO Right 04/15/2015   Procedure: CATARACT EXTRACTION PHACO AND INTRAOCULAR LENS PLACEMENT (IOC);  Surgeon: Estill Cotta, MD;  Location: [facility] ORS;  Service: Ophthalmology;  Laterality: Right;  [place] 01:53 AP% 26.2 CDE 54.15 fluid pack lot # [id number]  . COLONOSCOPY  2010   Dr. Vira Agar  . COLONOSCOPY WITH PROPOFOL N/A 04/17/2016   Procedure: COLONOSCOPY WITH PROPOFOL;  Surgeon: Manya Silvas, MD;  Location: [facility] ENDOSCOPY;  Service:  Endoscopy;  Laterality: N/A;  . EYE SURGERY    . MASTECTOMY Right 1987   with chemo tx  . RETINAL DETACHMENT SURGERY  10/2014  . SKIN CANCER EXCISION  2010,2012, 2018   face x 4  . TONSILLECTOMY  1962   Family History  Problem Relation Age of Onset  . Cancer Other        breast cancer FH  . Breast cancer Mother   . Breast cancer Paternal Grandmother   . Lung cancer Brother 19  . Diabetes Neg Hx   . Heart disease Neg Hx   . Colon cancer Neg Hx   . Ovarian cancer Neg Hx    Social History   Tobacco Use  . Smoking status: Current Every Day Smoker    Packs/day: 1.00    Years: 30.00    Pack years: 30.00    Types: Cigarettes  . Smokeless tobacco: Never Used  Substance Use Topics  . Alcohol use: Yes    Alcohol/week: 0.0 standard drinks    Comment: ocassionally  . Drug use: No    Current Outpatient Medications:  .  Alpha-Lipoic Acid 50 MG CAPS, , Disp: , Rfl:  .  aspirin (GOODSENSE ASPIRIN) 325 MG tablet, Take by mouth., Disp: , Rfl:  .  ASTRAGALUS PO, Take by mouth., Disp: , Rfl:  .  Calcium Citrate 250 MG TABS, , Disp: , Rfl:  .  CHROMIUM PO, , Disp: , Rfl:  .  Co-Enzyme  Q-10 30 MG CAPS, , Disp: , Rfl:  .  estradiol (ESTRACE) 0.1 MG/GM vaginal cream, Place 1 Applicatorful vaginally once a week., Disp: 42.5 g, Rfl: 3 .  GRAPE SEED EXTRACT PO, Take 1 tablet by mouth daily., Disp: , Rfl:  .  Lecithin 1200 MG CAPS, Take 1 capsule by mouth daily., Disp: , Rfl:  .  Loratadine (CLARITIN) 10 MG CAPS, Take by mouth., Disp: , Rfl:  .  Milk Thistle 250 MG CAPS, Take 1 capsule by mouth daily., Disp: , Rfl:  .  Multiple Vitamin (MULTI-VITAMINS) TABS, Take by mouth., Disp: , Rfl:  .  Multiple Vitamins-Minerals (WOMENS BONE HEALTH PO), Take 1 tablet by mouth daily., Disp: , Rfl:  .  selenium 50 MCG TABS tablet, , Disp: , Rfl:  .  vitamin C (ASCORBIC ACID) 250 MG tablet, Take 750 mg by mouth daily., Disp: , Rfl:  .  Zinc 30 MG TABS, Take 1 tablet by mouth daily., Disp: , Rfl:    Allergies: Acrylic polymer [carbomer] and Eggs or egg-derived products  Review of Systems  Constitutional: Positive for malaise/fatigue. Negative for chills and fever.  HENT: Negative for congestion, sinus pain and sore throat.   Eyes: Negative for blurred vision and pain.  Respiratory: Negative for cough and wheezing.   Cardiovascular: Negative for chest pain and leg swelling.  Gastrointestinal: Negative for abdominal pain, constipation, diarrhea, heartburn, nausea and vomiting.  Genitourinary: Negative for dysuria, frequency, hematuria and urgency.  Musculoskeletal: Negative for back pain, joint pain, myalgias and neck pain.  Skin: Negative for itching and rash.  Neurological: Positive for weakness. Negative for dizziness and tremors.  Endo/Heme/Allergies: Does not bruise/bleed easily.  Psychiatric/Behavioral: Negative for depression. The patient is not nervous/anxious and does not have insomnia.     Objective: BP 130/90   Ht 5' 6" (1.676 m)   Wt 138 lb (62.6 kg)   BMI 22.27 kg/m   Filed Weights   04/28/18 1414  Weight: 138 lb (62.6 kg)   Body mass index is 22.27 kg/m. Physical Exam Constitutional:      General: She is not in acute distress.    Appearance: She is well-developed.  Genitourinary:     Pelvic exam was performed with patient supine.     Vagina, uterus and rectum normal.     No lesions in the vagina.     No vaginal bleeding.     No cervical motion tenderness, friability, lesion or polyp.     Uterus is mobile.     Uterus is not enlarged.     No uterine mass detected.    Uterus is midaxial.     No right or left adnexal mass present.     Right adnexa not tender.     Left adnexa not tender.  HENT:     Head: Normocephalic and atraumatic. No laceration.     Right Ear: Hearing normal.     Left Ear: Hearing normal.     Mouth/Throat:     Pharynx: Uvula midline.  Eyes:     Pupils: Pupils are equal, round, and reactive to light.  Neck:     Musculoskeletal:  Normal range of motion and neck supple.     Thyroid: No thyromegaly.  Cardiovascular:     Rate and Rhythm: Normal rate and regular rhythm.     Heart sounds: No murmur. No friction rub. No gallop.   Pulmonary:     Effort: Pulmonary effort is normal. No respiratory distress.  Breath sounds: Normal breath sounds. No wheezing.  Chest:     Breasts:        Right: No mass, skin change or tenderness.        Left: No mass, skin change or tenderness.  Abdominal:     General: Bowel sounds are normal. There is no distension.     Palpations: Abdomen is soft.     Tenderness: There is no abdominal tenderness. There is no rebound.  Musculoskeletal: Normal range of motion.  Neurological:     Mental Status: She is alert and oriented to person, place, and time.     Cranial Nerves: No cranial nerve deficit.  Skin:    General: Skin is warm and dry.  Psychiatric:        Judgment: Judgment normal.  Vitals signs reviewed.   Assessment: Annual Exam 1. Screening breast examination   2. Screening for malignant neoplasm of cervix   3. Encounter for mammogram to establish baseline mammogram   4. Screen for colon cancer    Plan:            1.  Cervical Screening-  Pap smear done today  2. Breast screening- Exam annually and mammogram scheduled  3. Colonoscopy every 10 years, Hemoccult testing after age 92  4. Labs CBC today for reported muscle weakness.  Also gene labs.  5. Counseling for hormonal therapy: no change in therapy today Cont Estrace vag cream once weekly Replens discussed as adjunct  6.  She presents with a significant personal and/or family history of breast cancer (self, many others). Details of which can be found in her medical/family history. She does not have a previously identified BRCA and Lynch syndrome mutation in her family. Due to her personal and/or family history of cancer she is a candidate for the [facility] test(s).    Risk for cancer, genetic susceptibility discussed.   Patient has requested gene testing.  Discussed BRCA as well as Lynch syndrome and other cancer risk assessments available based on her family history and personal history. Pros and cons of testing discussed.     F/U  Return in about 1 year (around 04/29/2019) for Annual.  Barnett Applebaum, MD, [facility], [facility] 04/28/2018  2:47 PM

## 2018-04-28 NOTE — Patient Instructions (Signed)
PAP every three years Mammogram every year Colonoscopy every 10 years Labs yearly (with PCP)   

## 2018-04-29 LAB — CBC
HEMOGLOBIN: 15.5 g/dL (ref 11.1–15.9)
Hematocrit: 44.6 % (ref 34.0–46.6)
MCH: 31.1 pg (ref 26.6–33.0)
MCHC: 34.8 g/dL (ref 31.5–35.7)
MCV: 89 fL (ref 79–97)
Platelets: 214 10*3/uL (ref 150–450)
RBC: 4.99 x10E6/uL (ref 3.77–5.28)
RDW: 11.8 % (ref 11.7–15.4)
WBC: 7.6 10*3/uL (ref 3.4–10.8)

## 2018-04-30 DIAGNOSIS — Z1371 Encounter for nonprocreative screening for genetic disease carrier status: Secondary | ICD-10-CM

## 2018-04-30 HISTORY — DX: Encounter for nonprocreative screening for genetic disease carrier status: Z13.71

## 2018-05-02 LAB — CYTOLOGY - PAP
DIAGNOSIS: NEGATIVE
HPV: NOT DETECTED

## 2018-05-13 ENCOUNTER — Telehealth: Payer: Self-pay

## 2018-05-13 LAB — FECAL OCCULT BLOOD, IMMUNOCHEMICAL: Fecal Occult Bld: NEGATIVE

## 2018-05-13 NOTE — Telephone Encounter (Signed)
Adria calling from [facility]; Medicare Hospitalization Status form was faxed 05/04/18.  Pt's test is on hold until they receive this form back. Medicare will not allow them to do the test without this form.  If this form wasn't received please call Adria at [phone]

## 2018-05-16 NOTE — Telephone Encounter (Signed)
It has been scanned in her chart

## 2018-05-16 NOTE — Telephone Encounter (Signed)
I printed all forms from Chart and will fax back to [facility].

## 2018-05-16 NOTE — Telephone Encounter (Signed)
I remember getting this form, gave it to Elder Love, she in turn was going to talk to [facility] about it. Message forwarded to J. Sharlett Iles. I do not have [place] forms at my desk

## 2018-05-16 NOTE — Telephone Encounter (Signed)
Do you have these forms?

## 2018-05-25 ENCOUNTER — Encounter: Payer: Self-pay | Admitting: Obstetrics and Gynecology

## 2018-06-01 ENCOUNTER — Telehealth: Payer: Self-pay

## 2018-06-01 NOTE — Telephone Encounter (Signed)
Pt returning PH's call re lab results.  [phone]

## 2018-07-21 ENCOUNTER — Encounter: Payer: Self-pay | Admitting: *Deleted

## 2018-09-13 ENCOUNTER — Telehealth: Payer: Self-pay | Admitting: *Deleted

## 2018-09-13 NOTE — Telephone Encounter (Signed)
Left message for patient to notify them that it is time to schedule annual low dose lung cancer screening CT scan. Instructed patient to call back to verify information prior to the scan being scheduled.  

## 2018-09-16 ENCOUNTER — Telehealth: Payer: Self-pay

## 2018-09-16 ENCOUNTER — Telehealth: Payer: Self-pay | Admitting: *Deleted

## 2018-09-16 DIAGNOSIS — Z87891 Personal history of nicotine dependence: Secondary | ICD-10-CM

## 2018-09-16 DIAGNOSIS — Z122 Encounter for screening for malignant neoplasm of respiratory organs: Secondary | ICD-10-CM

## 2018-09-16 NOTE — Telephone Encounter (Signed)
Patient has been notified that annual lung cancer screening low dose CT scan is due currently or will be in near future. Confirmed that patient is within the age range of 55-77, and asymptomatic, (no signs or symptoms of lung cancer). Patient denies illness that would prevent curative treatment for lung cancer if found. Verified smoking history, (current, 62 pack year). The shared decision making visit was done 08/10/14. Patient is agreeable for CT scan being scheduled.

## 2018-09-16 NOTE — Telephone Encounter (Signed)
Call pt regarding lung screening. PT would like a phone call before scheduling scan.

## 2018-09-23 ENCOUNTER — Other Ambulatory Visit: Payer: Self-pay

## 2018-09-23 ENCOUNTER — Ambulatory Visit
Admission: RE | Admit: 2018-09-23 | Discharge: 2018-09-23 | Disposition: A | Discharge status: Home / Self Care | Payer: Medicare Other | Source: Ambulatory Visit | Attending: Nurse Practitioner | Admitting: Nurse Practitioner

## 2018-09-23 DIAGNOSIS — Z87891 Personal history of nicotine dependence: Secondary | ICD-10-CM | POA: Diagnosis present

## 2018-09-23 DIAGNOSIS — Z122 Encounter for screening for malignant neoplasm of respiratory organs: Secondary | ICD-10-CM | POA: Diagnosis present

## 2018-09-26 ENCOUNTER — Encounter: Payer: Self-pay | Admitting: *Deleted

## 2019-02-14 NOTE — Telephone Encounter (Signed)
Message closed - no communication since.

## 2019-03-07 ENCOUNTER — Other Ambulatory Visit: Payer: Self-pay | Admitting: Internal Medicine

## 2019-03-07 DIAGNOSIS — Z1231 Encounter for screening mammogram for malignant neoplasm of breast: Secondary | ICD-10-CM

## 2019-06-14 ENCOUNTER — Ambulatory Visit
Admission: RE | Admit: 2019-06-14 | Discharge: 2019-06-14 | Disposition: A | Discharge status: Home / Self Care | Payer: Medicare Other | Source: Ambulatory Visit | Attending: Internal Medicine | Admitting: Internal Medicine

## 2019-06-14 DIAGNOSIS — Z1231 Encounter for screening mammogram for malignant neoplasm of breast: Secondary | ICD-10-CM

## 2019-09-22 ENCOUNTER — Telehealth: Payer: Self-pay | Admitting: *Deleted

## 2019-09-22 DIAGNOSIS — Z87891 Personal history of nicotine dependence: Secondary | ICD-10-CM

## 2019-09-22 DIAGNOSIS — Z122 Encounter for screening for malignant neoplasm of respiratory organs: Secondary | ICD-10-CM

## 2019-09-22 NOTE — Telephone Encounter (Signed)
Patient was notified that is time for her Low Dose Lung Cancer Screening CT. She currently smokes 1 1/2 packs of cigarettes a day. She has had no major events this past year. No evaluation or treatment for cancer. She had her [place] Covid injs. in [place] Feb. Her insurane has not changed and she prefers afternoon appointments.

## 2019-09-25 NOTE — Addendum Note (Signed)
Addended by: Lieutenant Diego on: 09/25/2019 11:43 AM   Modules accepted: Orders

## 2019-09-25 NOTE — Telephone Encounter (Signed)
Smoking history: current, 63.5 pack year

## 2019-09-29 ENCOUNTER — Other Ambulatory Visit: Payer: Self-pay

## 2019-09-29 ENCOUNTER — Ambulatory Visit
Admission: RE | Admit: 2019-09-29 | Discharge: 2019-09-29 | Disposition: A | Discharge status: Home / Self Care | Payer: Medicare Other | Source: Ambulatory Visit | Attending: Oncology | Admitting: Oncology

## 2019-09-29 DIAGNOSIS — Z87891 Personal history of nicotine dependence: Secondary | ICD-10-CM | POA: Diagnosis present

## 2019-09-29 DIAGNOSIS — Z122 Encounter for screening for malignant neoplasm of respiratory organs: Secondary | ICD-10-CM | POA: Insufficient documentation

## 2019-10-03 ENCOUNTER — Encounter: Payer: Self-pay | Admitting: *Deleted

## 2019-11-01 ENCOUNTER — Other Ambulatory Visit: Payer: Self-pay

## 2019-11-01 ENCOUNTER — Encounter: Payer: Self-pay | Admitting: Obstetrics & Gynecology

## 2019-11-01 ENCOUNTER — Ambulatory Visit (INDEPENDENT_AMBULATORY_CARE_PROVIDER_SITE_OTHER): Payer: Medicare Other | Admitting: Obstetrics & Gynecology

## 2019-11-01 VITALS — BP 130/80 | Ht 66.0 in | Wt 138.0 lb

## 2019-11-01 DIAGNOSIS — Z1211 Encounter for screening for malignant neoplasm of colon: Secondary | ICD-10-CM

## 2019-11-01 DIAGNOSIS — N952 Postmenopausal atrophic vaginitis: Secondary | ICD-10-CM | POA: Diagnosis not present

## 2019-11-01 DIAGNOSIS — Z01419 Encounter for gynecological examination (general) (routine) without abnormal findings: Secondary | ICD-10-CM | POA: Diagnosis not present

## 2019-11-01 MED ORDER — ESTRADIOL 0.1 MG/GM VA CREA: 1.0000 | TOPICAL_CREAM | VAGINAL | 3 refills | Status: DC

## 2019-11-01 NOTE — Patient Instructions (Signed)
PAP every three years Mammogram every year    Call [phone] to schedule at [facility] Colonoscopy every 10 years Labs yearly (with PCP)  Thank you for choosing [facility]. As part of our ongoing efforts to improve patient experience, we would appreciate your feedback. Please fill out the short survey that you will receive by mail or MyChart. Your opinion is important to [place]! - Dr. Kenton Kingfisher

## 2019-11-01 NOTE — Progress Notes (Addendum)
HPI:      [name] is a 74 y.o. [id number] who LMP was in the past, she presents today for her annual examination.  The patient has no complaints today. The patient is not sexually active. Herlast pap: approximate date 2020 and was normal and last mammogram: approximate date 2021 and was normal.  The patient does perform self breast exams.  There is no notable family history of breast or ovarian cancer in her family although pt herself had Breast Cancer.  The patient is taking hormone replacement therapy in that she uses Est Vag Cream less than 0.5 g two times weekly. Patient denies post-menopausal vaginal bleeding.   The patient has regular exercise: yes. The patient denies current symptoms of depression.    GYN Hx: Last Colonoscopy:2 years ago. Normal.   PMHx: Past Medical History:  Diagnosis Date  . Allergy   . BRCA negative 04/2018   MyRisk neg  . Cancer ([place]) 2012   face x 4  . Cancer [facility]) 1987   right breast. mastectomy  . Detached retina   . Iritis 2005  . Personal history of malignant neoplasm of breast 1987   right mastectomy  . Personal history of tobacco use, presenting hazards to health   . Personal history of tobacco use, presenting hazards to health 08/02/2015  . Special screening for malignant neoplasms, colon   . Tobacco abuse 08/08/2014   Past Surgical History:  Procedure Laterality Date  . BREAST SURGERY Right    mastectomy  . CATARACT EXTRACTION W/ INTRAOCULAR LENS IMPLANT Left   . CATARACT EXTRACTION W/PHACO Right 04/15/2015   Procedure: CATARACT EXTRACTION PHACO AND INTRAOCULAR LENS PLACEMENT (IOC);  Surgeon: Estill Cotta, MD;  Location: [facility] ORS;  Service: Ophthalmology;  Laterality: Right;  [place] 01:53 AP% 26.2 CDE 54.15 fluid pack lot # [id number]  . COLONOSCOPY  2010   Dr. Vira Agar  . COLONOSCOPY WITH PROPOFOL N/A 04/17/2016   Procedure: COLONOSCOPY WITH PROPOFOL;  Surgeon: Manya Silvas, MD;  Location: [facility] ENDOSCOPY;  Service: Endoscopy;   Laterality: N/A;  . EYE SURGERY    . MASTECTOMY Right 1987   with chemo tx  . RETINAL DETACHMENT SURGERY  10/2014  . SKIN CANCER EXCISION  2010,2012, 2018   face x 4  . TONSILLECTOMY  1962   Family History  Problem Relation Age of Onset  . Cancer Other        breast cancer FH  . Breast cancer Mother   . Breast cancer Paternal Grandmother   . Lung cancer Brother 24  . Diabetes Neg Hx   . Heart disease Neg Hx   . Colon cancer Neg Hx   . Ovarian cancer Neg Hx    Social History   Tobacco Use  . Smoking status: Current Every Day Smoker    Packs/day: 1.00    Years: 30.00    Pack years: 30.00    Types: Cigarettes  . Smokeless tobacco: Never Used  Vaping Use  . Vaping Use: Some days  Substance Use Topics  . Alcohol use: Yes    Alcohol/week: 0.0 standard drinks    Comment: ocassionally  . Drug use: No    Current Outpatient Medications:  .  Alpha-Lipoic Acid 50 MG CAPS, , Disp: , Rfl:  .  aspirin (GOODSENSE ASPIRIN) 325 MG tablet, Take by mouth., Disp: , Rfl:  .  ASTRAGALUS PO, Take by mouth., Disp: , Rfl:  .  Calcium Citrate 250 MG TABS, , Disp: , Rfl:  .  CHROMIUM PO, , Disp: , Rfl:  .  Co-Enzyme Q-10 30 MG CAPS, , Disp: , Rfl:  .  estradiol (ESTRACE) 0.1 MG/GM vaginal cream, Place 1 Applicatorful vaginally once a week., Disp: 42.5 g, Rfl: 3 .  GRAPE SEED EXTRACT PO, Take 1 tablet by mouth daily., Disp: , Rfl:  .  Lecithin 1200 MG CAPS, Take 1 capsule by mouth daily., Disp: , Rfl:  .  Loratadine (CLARITIN) 10 MG CAPS, Take by mouth., Disp: , Rfl:  .  Milk Thistle 250 MG CAPS, Take 1 capsule by mouth daily., Disp: , Rfl:  .  Multiple Vitamin (MULTI-VITAMINS) TABS, Take by mouth., Disp: , Rfl:  .  Multiple Vitamins-Minerals (WOMENS BONE HEALTH PO), Take 1 tablet by mouth daily., Disp: , Rfl:  .  selenium 50 MCG TABS tablet, , Disp: , Rfl:  .  vitamin C (ASCORBIC ACID) 250 MG tablet, Take 750 mg by mouth daily., Disp: , Rfl:  .  Zinc 30 MG TABS, Take 1 tablet by mouth  daily., Disp: , Rfl:  Allergies: Acrylic polymer [carbomer] and Eggs or egg-derived products  Review of Systems  Constitutional: Negative for chills, fever and malaise/fatigue.  HENT: Negative for congestion, sinus pain and sore throat.   Eyes: Negative for blurred vision and pain.  Respiratory: Negative for cough and wheezing.   Cardiovascular: Negative for chest pain and leg swelling.  Gastrointestinal: Negative for abdominal pain, constipation, diarrhea, heartburn, nausea and vomiting.  Genitourinary: Negative for dysuria, frequency, hematuria and urgency.  Musculoskeletal: Negative for back pain, joint pain, myalgias and neck pain.  Skin: Negative for itching and rash.  Neurological: Negative for dizziness, tremors and weakness.  Endo/Heme/Allergies: Does not bruise/bleed easily.  Psychiatric/Behavioral: Negative for depression. The patient is not nervous/anxious and does not have insomnia.     Objective: BP 130/80   Ht '5\' 6"'  (1.676 m)   Wt 138 lb (62.6 kg)   BMI 22.27 kg/m   Filed Weights   11/01/19 1425  Weight: 138 lb (62.6 kg)   Body mass index is 22.27 kg/m. Physical Exam Constitutional:      General: She is not in acute distress.    Appearance: She is well-developed.  Genitourinary:     Pelvic exam was performed with patient supine.     Vagina, uterus and rectum normal.     No lesions in the vagina.     No vaginal bleeding.     No cervical motion tenderness, friability, lesion or polyp.     Uterus is mobile.     Uterus is not enlarged.     No uterine mass detected.    Uterus is midaxial.     No right or left adnexal mass present.     Right adnexa not tender.     Left adnexa not tender.  HENT:     Head: Normocephalic and atraumatic. No laceration.     Right Ear: Hearing normal.     Left Ear: Hearing normal.     Mouth/Throat:     Pharynx: Uvula midline.  Eyes:     Pupils: Pupils are equal, round, and reactive to light.  Neck:     Thyroid: No thyromegaly.   Cardiovascular:     Rate and Rhythm: Normal rate and regular rhythm.     Heart sounds: No murmur heard.  No friction rub. No gallop.   Pulmonary:     Effort: Pulmonary effort is normal. No respiratory distress.     Breath sounds: Normal breath  sounds. No wheezing.  Chest:     Breasts:        Right: Absent. No mass, skin change or tenderness.        Left: No mass, skin change or tenderness.     Comments: Right breast absent, no mass Abdominal:     General: Bowel sounds are normal. There is no distension.     Palpations: Abdomen is soft.     Tenderness: There is no abdominal tenderness. There is no rebound.  Musculoskeletal:        General: Normal range of motion.     Cervical back: Normal range of motion and neck supple.  Neurological:     Mental Status: She is alert and oriented to person, place, and time.     Cranial Nerves: No cranial nerve deficit.  Skin:    General: Skin is warm and dry.  Psychiatric:        Judgment: Judgment normal.  Vitals reviewed.     Assessment: Annual Exam 1. Screen for colon cancer   2. Women's annual routine gynecological examination   3. Vaginal atrophy     Plan:            1.  Cervical Screening-  Pap smear schedule reviewed with patient  2. Breast screening- Exam annually and mammogram scheduled  3. Colonoscopy every 10 years, Hemoccult testing after age 96  4. Labs managed by PCP  5. Counseling for hormonal therapy: no change in therapy today              6. FRAX - FRAX score for assessing the 10 year probability for fracture calculated and discussed today.  Based on age and score today, DEXA is not currently scheduled.   7. Vag atrophy, cont tx w est vag cream; pros and cons discussed    F/U  Return in about 1 year (around 10/31/2020) for Annual.  Barnett Applebaum, MD, [facility], [facility] 11/01/2019  2:44 PM

## 2019-11-25 LAB — FECAL OCCULT BLOOD, IMMUNOCHEMICAL: Fecal Occult Bld: NEGATIVE

## 2020-01-04 ENCOUNTER — Telehealth: Payer: Self-pay

## 2020-01-04 NOTE — Telephone Encounter (Signed)
Pt calling; was seen 11/01/19; recv'd statement from [place] we were paid zero; she called Medicare; they said we used the wrong code.  Pt doesn't know what to do next but wanted to let us know.  Pt would like a call.  [phone]

## 2020-01-08 NOTE — Telephone Encounter (Signed)
Returned patients' call. We agreed I would send to billing/ coding to review. Patient to call back in one week if I have not contacted her with an update by that time.

## 2020-02-13 NOTE — Addendum Note (Signed)
Addended by: Gae Dry on: 02/13/2020 04:41 PM   Modules accepted: Level of Service

## 2020-05-30 ENCOUNTER — Other Ambulatory Visit: Payer: Self-pay | Admitting: Internal Medicine

## 2020-05-30 DIAGNOSIS — Z1231 Encounter for screening mammogram for malignant neoplasm of breast: Secondary | ICD-10-CM

## 2020-06-25 ENCOUNTER — Other Ambulatory Visit: Payer: Self-pay

## 2020-06-25 ENCOUNTER — Ambulatory Visit
Admission: RE | Admit: 2020-06-25 | Discharge: 2020-06-25 | Disposition: A | Discharge status: Home / Self Care | Payer: Medicare Other | Source: Ambulatory Visit | Attending: Internal Medicine | Admitting: Internal Medicine

## 2020-06-25 DIAGNOSIS — Z1231 Encounter for screening mammogram for malignant neoplasm of breast: Secondary | ICD-10-CM | POA: Diagnosis present

## 2020-11-04 ENCOUNTER — Other Ambulatory Visit: Payer: Self-pay

## 2020-11-04 ENCOUNTER — Ambulatory Visit (INDEPENDENT_AMBULATORY_CARE_PROVIDER_SITE_OTHER): Payer: Medicare Other | Admitting: Obstetrics & Gynecology

## 2020-11-04 ENCOUNTER — Encounter: Payer: Self-pay | Admitting: Obstetrics & Gynecology

## 2020-11-04 VITALS — BP 120/80 | Ht 66.0 in | Wt 136.0 lb

## 2020-11-04 DIAGNOSIS — Z01419 Encounter for gynecological examination (general) (routine) without abnormal findings: Secondary | ICD-10-CM

## 2020-11-04 DIAGNOSIS — R1032 Left lower quadrant pain: Secondary | ICD-10-CM

## 2020-11-04 DIAGNOSIS — N952 Postmenopausal atrophic vaginitis: Secondary | ICD-10-CM

## 2020-11-04 MED ORDER — ESTRADIOL 0.1 MG/GM VA CREA: 1.0000 | TOPICAL_CREAM | VAGINAL | 3 refills | Status: AC

## 2020-11-04 NOTE — Patient Instructions (Signed)
Recommendations to boost your immunity to prevent illness such as viral flu and colds, including covid19, are as follows:       - - -  Vitamin K2 and Vitamin D3  - - - Take Vitamin K2 at 200-300 mcg daily (usually 2-3 pills daily of the over the counter formulation). Take Vitamin D3 at 3000-4000 U daily (usually 3-4 pills daily of the over the counter formulation). Studies show that these two at high normal levels in your system are very effective in keeping your immunity so strong and protective that you will be unlikely to contract viral illness such as those listed above.  Dr Kenton Kingfisher

## 2020-11-04 NOTE — Progress Notes (Signed)
HPI:      [name] is a 75 y.o. [id number] who LMP was in the past, she presents today for her annual examination.  The patient has no complaints today. She reports some occas LLQ / groin pain over the last few mos.  No bloating or nausea or weight gain noticed.The patient is not sexually active. Herlast pap: was normal and last mammogram: approximate date 2022 and was normal.  The patient does perform self breast exams.  There is no notable family history of breast or ovarian cancer in her family. The patient is taking hormone replacement therapy (vag est therapy 1-2 times weekly, helps w atrophy an dryness). Patient denies post-menopausal vaginal bleeding.   The patient has regular exercise: yes. The patient denies current symptoms of depression.    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 mastectomy   Detached retina    Iritis 2005   Personal history of malignant neoplasm of breast 1987   right mastectomy   Personal history of tobacco use, presenting hazards to health    Personal history of tobacco use, presenting hazards to health 08/02/2015   Special screening for malignant neoplasms, colon    Tobacco abuse 08/08/2014   Past Surgical History:  Procedure Laterality Date   BREAST SURGERY Right    mastectomy   CATARACT EXTRACTION W/ INTRAOCULAR LENS IMPLANT Left    CATARACT EXTRACTION W/PHACO Right 04/15/2015   Procedure: CATARACT EXTRACTION PHACO AND INTRAOCULAR LENS PLACEMENT ([place]);  Surgeon: Estill Cotta, MD;  Location: [facility] ORS;  Service: Ophthalmology;  Laterality: Right;  [place] 01:53 AP% 26.2 CDE 54.15 fluid pack lot # [id number]   COLONOSCOPY  2010   Dr. Vira Agar   COLONOSCOPY WITH PROPOFOL N/A 04/17/2016   Procedure: COLONOSCOPY WITH PROPOFOL;  Surgeon: Manya Silvas, MD;  Location: [facility] ENDOSCOPY;  Service: Endoscopy;  Laterality: N/A;   EYE SURGERY      MASTECTOMY Right 1987   with chemo tx   RETINAL DETACHMENT SURGERY  10/2014   SKIN CANCER EXCISION  2010,2012, 2018   face x 4   TONSILLECTOMY  1962   Family History  Problem Relation Age of Onset   Cancer Other        breast cancer FH   Breast cancer Mother    Breast cancer Paternal Grandmother    Lung cancer Brother 21   Diabetes Neg Hx    Heart disease Neg Hx    Colon cancer Neg Hx    Ovarian cancer Neg Hx    Social History   Tobacco Use   Smoking status: Every Day    Packs/day: 1.00    Years: 30.00    Pack years: 30.00    Types: Cigarettes   Smokeless tobacco: Never  Vaping Use   Vaping Use: Some days  Substance Use Topics   Alcohol use: Yes    Alcohol/week: 0.0 standard drinks    Comment: ocassionally   Drug use: No    Current Outpatient Medications:    Alpha-Lipoic Acid 50 MG CAPS, , Disp: , Rfl:    aspirin 325 MG tablet, Take by mouth., Disp: , Rfl:    ASTRAGALUS PO, Take by mouth., Disp: , Rfl:    Calcium Citrate 250 MG TABS, , Disp: , Rfl:    CHROMIUM PO, , Disp: ,  Rfl:    Co-Enzyme Q-10 30 MG CAPS, , Disp: , Rfl:    GRAPE SEED EXTRACT PO, Take 1 tablet by mouth daily., Disp: , Rfl:    Lecithin 1200 MG CAPS, Take 1 capsule by mouth daily., Disp: , Rfl:    Loratadine 10 MG CAPS, Take by mouth., Disp: , Rfl:    Milk Thistle 250 MG CAPS, Take 1 capsule by mouth daily., Disp: , Rfl:    Multiple Vitamin (MULTI-VITAMINS) TABS, Take by mouth., Disp: , Rfl:    Multiple Vitamins-Minerals (WOMENS BONE HEALTH PO), Take 1 tablet by mouth daily., Disp: , Rfl:    selenium 50 MCG TABS tablet, , Disp: , Rfl:    vitamin C (ASCORBIC ACID) 250 MG tablet, Take 750 mg by mouth daily., Disp: , Rfl:    Zinc 30 MG TABS, Take 1 tablet by mouth daily., Disp: , Rfl:    estradiol (ESTRACE) 0.1 MG/GM vaginal cream, Place 1 Applicatorful vaginally once a week., Disp: 42.5 g, Rfl: 3 Allergies: Acrylic polymer [carbomer] and Eggs or egg-derived products  Review of Systems   Constitutional:  Negative for chills, fever and malaise/fatigue.  HENT:  Negative for congestion, sinus pain and sore throat.   Eyes:  Negative for blurred vision and pain.  Respiratory:  Negative for cough and wheezing.   Cardiovascular:  Negative for chest pain and leg swelling.  Gastrointestinal:  Negative for abdominal pain, constipation, diarrhea, heartburn, nausea and vomiting.  Genitourinary:  Negative for dysuria, frequency, hematuria and urgency.  Musculoskeletal:  Positive for joint pain. Negative for back pain, myalgias and neck pain.  Skin:  Negative for itching and rash.  Neurological:  Positive for tingling. Negative for dizziness, tremors and weakness.  Endo/Heme/Allergies:  Does not bruise/bleed easily.  Psychiatric/Behavioral:  Negative for depression. The patient is not nervous/anxious and does not have insomnia.    Objective: BP 120/80   Ht _0  (1.676 m)   Wt 136 lb (61.7 kg)   BMI 21.95 kg/m   Filed Weights   11/04/20 1327  Weight: 136 lb (61.7 kg)   Body mass index is 21.95 kg/m. Physical Exam Constitutional:      General: She is not in acute distress.    Appearance: She is well-developed.  Genitourinary:     Bladder, rectum and urethral meatus normal.     No lesions in the vagina.     Right Labia: No rash, tenderness or lesions.    Left Labia: No tenderness, lesions or rash.    No vaginal bleeding.     Moderate vaginal atrophy present.     Right Adnexa: not tender and no mass present.    Left Adnexa: not tender and no mass present.    Adnexa exam comments: No mass.     No cervical motion tenderness, friability, lesion or polyp.     Uterus is not enlarged.     No uterine mass detected.    Uterus is midaxial.     Pelvic exam was performed with patient in the lithotomy position.  Breasts:    Right: Absent. No mass.     Left: No mass, skin change or tenderness.  HENT:     Head: Normocephalic and atraumatic. No laceration.     Right Ear: Hearing  normal.     Left Ear: Hearing normal.     Mouth/Throat:     Pharynx: Uvula midline.  Eyes:     Pupils: Pupils are equal, round, and reactive to light.  Neck:  Thyroid: No thyromegaly.  Cardiovascular:     Rate and Rhythm: Normal rate and regular rhythm.     Heart sounds: No murmur heard.   No friction rub. No gallop.  Pulmonary:     Effort: Pulmonary effort is normal. No respiratory distress.     Breath sounds: Normal breath sounds. No wheezing.  Abdominal:     General: Bowel sounds are normal. There is no distension.     Palpations: Abdomen is soft.     Tenderness: There is no abdominal tenderness. There is no rebound.  Musculoskeletal:        General: Normal range of motion.     Cervical back: Normal range of motion and neck supple.  Neurological:     Mental Status: She is alert and oriented to person, place, and time.     Cranial Nerves: No cranial nerve deficit.  Skin:    General: Skin is warm and dry.  Psychiatric:        Judgment: Judgment normal.  Vitals reviewed.    Assessment: Annual Exam 1. Women's annual routine gynecological examination   2. Vaginal atrophy   3. LLQ pain      Monitor and consider [place], although exam completely normal today  Plan:            1.  Cervical Screening-  Pap smear schedule reviewed with patient  2. Breast screening- Exam annually and mammogram scheduled  3. Colonoscopy every 10 years, Hemoccult testing after age 55  4. Labs managed by PCP  5. Counseling for hormonal therapy: no change in therapy today              6. FRAX - FRAX score for assessing the 10 year probability for fracture calculated and discussed today.  Based on age and score today, DEXA is not currently scheduled.    F/U  Return in about 1 year (around 11/04/2021) for Annual.  Barnett Applebaum, MD, [facility], [facility] 11/04/2020  1:56 PM

## 2021-01-08 ENCOUNTER — Other Ambulatory Visit: Payer: Self-pay | Admitting: Internal Medicine

## 2021-01-08 ENCOUNTER — Other Ambulatory Visit (HOSPITAL_COMMUNITY): Payer: Self-pay | Admitting: Internal Medicine

## 2021-01-08 DIAGNOSIS — I77810 Thoracic aortic ectasia: Secondary | ICD-10-CM

## 2021-01-08 DIAGNOSIS — R1084 Generalized abdominal pain: Secondary | ICD-10-CM

## 2021-01-13 ENCOUNTER — Other Ambulatory Visit: Payer: Self-pay

## 2021-01-13 ENCOUNTER — Ambulatory Visit
Admission: RE | Admit: 2021-01-13 | Discharge: 2021-01-13 | Disposition: A | Discharge status: Home / Self Care | Payer: Medicare Other | Source: Ambulatory Visit | Attending: Internal Medicine | Admitting: Internal Medicine

## 2021-01-13 DIAGNOSIS — R1084 Generalized abdominal pain: Secondary | ICD-10-CM | POA: Diagnosis present

## 2021-01-13 DIAGNOSIS — I77810 Thoracic aortic ectasia: Secondary | ICD-10-CM | POA: Diagnosis not present

## 2021-01-13 MED ORDER — IOHEXOL 350 MG/ML SOLN
75.0000 mL | Freq: Once | INTRAVENOUS | Status: AC | PRN
  Administered 2021-01-13: 75 mL via INTRAVENOUS

## 2021-04-23 ENCOUNTER — Encounter: Payer: Self-pay | Admitting: Gastroenterology

## 2021-04-24 ENCOUNTER — Other Ambulatory Visit: Payer: Self-pay

## 2021-04-24 ENCOUNTER — Ambulatory Visit: Payer: Medicare Other | Admitting: Anesthesiology

## 2021-04-24 ENCOUNTER — Encounter
Admission: RE | Disposition: A | Discharge status: Home / Self Care | Payer: Self-pay | Source: Home / Self Care | Attending: Gastroenterology

## 2021-04-24 ENCOUNTER — Ambulatory Visit
Admission: RE | Admit: 2021-04-24 | Discharge: 2021-04-24 | Disposition: A | Discharge status: Home / Self Care | Payer: Medicare Other | Attending: Gastroenterology | Admitting: Gastroenterology

## 2021-04-24 ENCOUNTER — Encounter: Payer: Self-pay | Admitting: Gastroenterology

## 2021-04-24 DIAGNOSIS — Z1211 Encounter for screening for malignant neoplasm of colon: Secondary | ICD-10-CM | POA: Diagnosis present

## 2021-04-24 DIAGNOSIS — K640 First degree hemorrhoids: Secondary | ICD-10-CM | POA: Diagnosis not present

## 2021-04-24 DIAGNOSIS — K2980 Duodenitis without bleeding: Secondary | ICD-10-CM | POA: Diagnosis not present

## 2021-04-24 DIAGNOSIS — D122 Benign neoplasm of ascending colon: Secondary | ICD-10-CM | POA: Diagnosis not present

## 2021-04-24 DIAGNOSIS — K5730 Diverticulosis of large intestine without perforation or abscess without bleeding: Secondary | ICD-10-CM | POA: Insufficient documentation

## 2021-04-24 DIAGNOSIS — K2970 Gastritis, unspecified, without bleeding: Secondary | ICD-10-CM | POA: Insufficient documentation

## 2021-04-24 DIAGNOSIS — Z8371 Family history of colonic polyps: Secondary | ICD-10-CM | POA: Insufficient documentation

## 2021-04-24 HISTORY — PX: ESOPHAGOGASTRODUODENOSCOPY (EGD) WITH PROPOFOL: SHX5813

## 2021-04-24 HISTORY — DX: Solitary pulmonary nodule: R91.1

## 2021-04-24 HISTORY — PX: COLONOSCOPY WITH PROPOFOL: SHX5780

## 2021-04-24 HISTORY — DX: Pulmonary fibrosis, unspecified: J84.10

## 2021-04-24 HISTORY — DX: Personal history of adenomatous and serrated colon polyps: Z86.0101

## 2021-04-24 HISTORY — DX: Hyperlipidemia, unspecified: E78.5

## 2021-04-24 HISTORY — DX: Age-related osteoporosis without current pathological fracture: M81.0

## 2021-04-24 HISTORY — DX: Thoracic aortic ectasia: I77.810

## 2021-04-24 SURGERY — COLONOSCOPY WITH PROPOFOL
Anesthesia: General

## 2021-04-24 MED ORDER — LIDOCAINE HCL (CARDIAC) PF 100 MG/5ML IV SOSY
PREFILLED_SYRINGE | INTRAVENOUS | Status: DC | PRN
Start: 2021-04-24 — End: 2021-04-24
  Administered 2021-04-24: 40 mg via INTRAVENOUS

## 2021-04-24 MED ORDER — SODIUM CHLORIDE 0.9 % IV SOLN: INTRAVENOUS | Status: DC

## 2021-04-24 MED ORDER — PROPOFOL 500 MG/50ML IV EMUL
INTRAVENOUS | Status: AC
  Filled 2021-04-24: qty 50

## 2021-04-24 MED ORDER — LIDOCAINE HCL (PF) 2 % IJ SOLN
INTRAMUSCULAR | Status: AC
  Filled 2021-04-24: qty 5

## 2021-04-24 MED ORDER — PROPOFOL 500 MG/50ML IV EMUL
INTRAVENOUS | Status: DC | PRN
  Administered 2021-04-24: 120 ug/kg/min via INTRAVENOUS

## 2021-04-24 NOTE — Interval H&P Note (Signed)
History and Physical Interval Note: Preprocedure H&P from 04/24/21  was reviewed and there was no interval change after seeing and examining the patient.  Written consent was obtained from the patient after discussion of risks, benefits, and alternatives. Patient has consented to proceed with Esophagogastroduodenoscopy and Colonoscopy with possible intervention   04/24/2021 8:42 AM  Marilyn Coleman  has presented today for surgery, with the diagnosis of Hx Adenomatous Polyps Abnormal CT scan.  The various methods of treatment have been discussed with the patient and family. After consideration of risks, benefits and other options for treatment, the patient has consented to  Procedure(s) with comments: COLONOSCOPY WITH PROPOFOL (N/A) - Requests early AM ESOPHAGOGASTRODUODENOSCOPY (EGD) WITH PROPOFOL (N/A) as a surgical intervention.  The patient's history has been reviewed, patient examined, no change in status, stable for surgery.  I have reviewed the patient's chart and labs.  Questions were answered to the patient's satisfaction.     [name]

## 2021-04-24 NOTE — Anesthesia Procedure Notes (Signed)
Date/Time: 04/24/2021 9:03 AM Performed by: Vaughan Sine Pre-anesthesia Checklist: Patient identified, Emergency Drugs available, Suction available, Patient being monitored and Timeout performed Patient Re-evaluated:Patient Re-evaluated prior to induction Oxygen Delivery Method: Nasal cannula Preoxygenation: Pre-oxygenation with 100% oxygen Induction Type: IV induction Airway Equipment and Method: Bite block Placement Confirmation: positive ETCO2 and CO2 detector

## 2021-04-24 NOTE — Op Note (Signed)
[facility] Gastroenterology Patient Name: Marilyn Coleman Procedure Date: 04/24/2021 8:53 AM MRN: [id number] Account #: [id number] Date of Birth: 1945/07/30 Admit Type: Outpatient Age: 76 Room: [facility] ENDO ROOM 1 Gender: Female Note Status: Finalized Instrument Name: Peds Colonoscope [id number] Procedure:             Colonoscopy Indications:           High risk colon cancer surveillance: Personal history                         of colonic polyps Providers:             Rueben Bash, DO Referring MD:          Ramonita Lab, MD (Referring MD) Medicines:             Monitored Anesthesia Care Complications:         No immediate complications. Estimated blood loss:                         Minimal. Procedure:             Pre-Anesthesia Assessment:                        - Prior to the procedure, a History and Physical was                         performed, and patient medications and allergies were                         reviewed. The patient is competent. The risks and                         benefits of the procedure and the sedation options and                         risks were discussed with the patient. All questions                         were answered and informed consent was obtained.                         Patient identification and proposed procedure were                         verified by the physician, the nurse, the anesthetist                         and the technician in the endoscopy suite. Mental                         Status Examination: alert and oriented. Airway                         Examination: normal oropharyngeal airway and neck                         mobility. Respiratory Examination: clear to  auscultation. CV Examination: RRR, no murmurs, no S3                         or S4. Prophylactic Antibiotics: The patient does not                         require prophylactic antibiotics. Prior                          Anticoagulants: The patient has taken no previous                         anticoagulant or antiplatelet agents. ASA Grade                         Assessment: II - A patient with mild systemic disease.                         After reviewing the risks and benefits, the patient                         was deemed in satisfactory condition to undergo the                         procedure. The anesthesia plan was to use monitored                         anesthesia care (MAC). Immediately prior to                         administration of medications, the patient was                         re-assessed for adequacy to receive sedatives. The                         heart rate, respiratory rate, oxygen saturations,                         blood pressure, adequacy of pulmonary ventilation, and                         response to care were monitored throughout the                         procedure. The physical status of the patient was                         re-assessed after the procedure.                        After obtaining informed consent, the colonoscope was                         passed under direct vision. Throughout the procedure,                         the patient's blood pressure, pulse, and oxygen  saturations were monitored continuously. The                         Colonoscope was introduced through the anus and                         advanced to the the cecum, identified by appendiceal                         orifice and ileocecal valve. The colonoscopy was                         performed without difficulty. The patient tolerated                         the procedure well. The quality of the bowel                         preparation was evaluated using the BBPS [place] Bowel                         Preparation Scale) with scores of: Right Colon = 3,                         Transverse Colon = 3 and Left Colon = 3 (entire mucosa                         seen  well with no residual staining, small fragments                         of stool or opaque liquid). The total BBPS score                         equals 9. Findings:      The perianal and digital rectal examinations were normal. Pertinent       negatives include normal sphincter tone.      A 1 to 2 mm polyp was found in the ascending colon. The polyp was       sessile. The polyp was removed with a cold biopsy forceps. Resection and       retrieval were complete. Estimated blood loss was minimal.      Many small-mouthed diverticula were found in the left colon. Estimated       blood loss: none.      Non-bleeding internal hemorrhoids were found during retroflexion. The       hemorrhoids were Grade I (internal hemorrhoids that do not prolapse).       Estimated blood loss: none.      The exam was otherwise without abnormality on direct and retroflexion       views. Impression:            - One 1 to 2 mm polyp in the ascending colon, removed                         with a cold biopsy forceps. Resected and retrieved.                        - Diverticulosis in the left  colon.                        - Non-bleeding internal hemorrhoids.                        - The examination was otherwise normal on direct and                         retroflexion views. Recommendation:        - Discharge patient to home.                        - Resume previous diet.                        - Continue present medications.                        - Await pathology results.                        - Repeat colonoscopy for surveillance based on                         pathology results.                        - Return to GI office as previously scheduled. Procedure Code(s):     --- Professional ---                        [phone], Colonoscopy, flexible; with biopsy, single or                         multiple Diagnosis Code(s):     --- Professional ---                        Z86.010, Personal history of colonic  polyps                        K63.5, Polyp of colon                        K64.0, First degree hemorrhoids                        K57.30, Diverticulosis of large intestine without                         perforation or abscess without bleeding CPT copyright 2019 American Medical Association. All rights reserved. The codes documented in this report are preliminary and upon coder review may  be revised to meet current compliance requirements. Attending Participation:      I personally performed the entire procedure. Volney American, DO [name] DO, DO 04/24/2021 9:48:48 AM This report has been signed electronically. Number of Addenda: 0 Note Initiated On: 04/24/2021 8:53 AM Scope Withdrawal Time: 0 hours 13 minutes 27 seconds  Total Procedure Duration: 0 hours 28 minutes 18 seconds  Estimated Blood Loss:  Estimated blood loss was minimal.      [facility]

## 2021-04-24 NOTE — Anesthesia Postprocedure Evaluation (Signed)
Anesthesia Post Note  Patient: Reather Converse  Procedure(s) Performed: COLONOSCOPY WITH PROPOFOL ESOPHAGOGASTRODUODENOSCOPY (EGD) WITH PROPOFOL  Patient location during evaluation: PACU Anesthesia Type: General Level of consciousness: awake and alert, oriented and patient cooperative Pain management: pain level controlled Vital Signs Assessment: post-procedure vital signs reviewed and stable Respiratory status: spontaneous breathing, nonlabored ventilation and respiratory function stable Cardiovascular status: blood pressure returned to baseline and stable Postop Assessment: adequate PO intake Anesthetic complications: no   No notable events documented.   Last Vitals:  Vitals:   04/24/21 0952 04/24/21 1002  BP: (!) 132/53 (!) 114/50  Pulse: 79 73  Resp: 15 18  Temp:    SpO2: 97% 98%    Last Pain:  Vitals:   04/24/21 0952  TempSrc:   PainSc: 0-No pain                 Darrin Nipper

## 2021-04-24 NOTE — H&P (Signed)
Pre-Procedure H&P   Patient ID: Marilyn Coleman is a 76 y.o. female.  Gastroenterology Provider: [name], DO  Referring Provider: Dawson Bills, NP PCP: Adin Hector, MD  Date: 04/24/2021  HPI [name] is a 76 y.o. female who presents today for Esophagogastroduodenoscopy and Colonoscopy for abnormal ct with thickened stomach; abdominal pain; polyp surveillance.  CT also demonstrated inc stool burden and 4.2cm asc thoracic aorta.  05/2005- colon- one ta, ih tics 03/2016- colon- tics, ih, no polyps  Fhx- brother polyps  Past Medical History:  Diagnosis Date   Allergy    BRCA negative 04/2018   MyRisk neg   Cancer ([place]) 2012   face x 4   Cancer ([place]) 1987   right breast. 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[place])    Hx of adenomatous colonic polyps    Hyperlipidemia    Iritis 2005   Osteoporosis    Personal history of malignant neoplasm of breast 1987   right mastectomy   Personal history of tobacco use, presenting hazards to health    Personal history of tobacco use, presenting hazards to health 08/02/2015   Pulmonary nodule    Special screening for malignant neoplasms, colon    Tobacco abuse 08/08/2014    Past Surgical History:  Procedure Laterality Date   BREAST SURGERY Right    mastectomy   CATARACT EXTRACTION W/ INTRAOCULAR LENS IMPLANT Left    CATARACT EXTRACTION W/PHACO Right 04/15/2015   Procedure: CATARACT EXTRACTION PHACO AND INTRAOCULAR LENS PLACEMENT ([place]);  Surgeon: Estill Cotta, MD;  Location: [facility] ORS;  Service: Ophthalmology;  Laterality: Right;  [place] 01:53 AP% 26.2 CDE 54.15 fluid pack lot # [id number]   COLONOSCOPY  2010   Dr. Vira Agar   COLONOSCOPY WITH PROPOFOL N/A 04/17/2016   Procedure: COLONOSCOPY WITH PROPOFOL;  Surgeon: Manya Silvas, MD;  Location: [facility] ENDOSCOPY;  Service: Endoscopy;  Laterality: N/A;   EYE SURGERY     MASTECTOMY Right 1987   with chemo tx    RETINAL DETACHMENT SURGERY  10/2014   SKIN CANCER EXCISION  2010,2012, 2018   face x 4   TONSILLECTOMY  1962    Family History Fhx- brother polyps No other h/o GI disease or malignancy  Review of Systems  Constitutional:  Negative for activity change, appetite change, chills, diaphoresis, fatigue, fever and unexpected weight change.  HENT:  Negative for trouble swallowing and voice change.   Respiratory:  Negative for shortness of breath and wheezing.   Cardiovascular:  Negative for chest pain, palpitations and leg swelling.  Gastrointestinal:  Positive for abdominal pain and constipation. Negative for abdominal distention, anal bleeding, blood in stool, diarrhea, nausea, rectal pain and vomiting.  Musculoskeletal:  Negative for arthralgias and myalgias.  Skin:  Negative for color change and pallor.  Neurological:  Negative for dizziness, syncope and weakness.  Psychiatric/Behavioral:  Negative for confusion.   All other systems reviewed and are negative.   Medications No current facility-administered medications on file prior to encounter.   Current Outpatient Medications on File Prior to Encounter  Medication Sig Dispense Refill   Alpha-Lipoic Acid 50 MG CAPS      aspirin 325 MG tablet Take by mouth.     ASTRAGALUS PO Take by mouth.     Calcium Citrate 250 MG TABS      CHROMIUM PO      Co-Enzyme Q-10 30 MG CAPS  estradiol (ESTRACE) 0.1 MG/GM vaginal cream Place 1 Applicatorful vaginally once a week. 42.5 g 3   GRAPE SEED EXTRACT PO Take 1 tablet by mouth daily.     Lecithin 1200 MG CAPS Take 1 capsule by mouth daily.     Loratadine 10 MG CAPS Take by mouth.     Milk Thistle 250 MG CAPS Take 1 capsule by mouth daily.     Multiple Vitamins-Minerals (WOMENS BONE HEALTH PO) Take 1 tablet by mouth daily.     selenium 50 MCG TABS tablet      vitamin C (ASCORBIC ACID) 250 MG tablet Take 750 mg by mouth daily.     Zinc 30 MG TABS Take 1 tablet by mouth daily.     Multiple  Vitamin (MULTI-VITAMINS) TABS Take by mouth.      Pertinent medications related to GI and procedure were reviewed by me with the patient prior to the procedure   Current Facility-Administered Medications:    0.9 %  sodium chloride infusion, , Intravenous, Continuous, [name], DO, Last Rate: 20 mL/hr at 04/24/21 0829, New Bag at 04/24/21 0829      Allergies  Allergen Reactions   Acrylic Polymer [Carbomer] Itching    Allergy is to acrylic acid   Eggs Or Egg-Derived Products Other (See Comments)    Joint pain   Allergies were reviewed by me prior to the procedure  Objective    Vitals:   04/24/21 0812  BP: (!) 144/86  Pulse: (!) 116  Resp: 15  Temp: (!) 97.4 F (36.3 C)  TempSrc: Temporal  SpO2: 98%  Weight: 62.6 kg  Height: $Remove'5\' 6"'tkVSVWO$  (1.676 m)     Physical Exam Vitals and nursing note reviewed.  Constitutional:      General: She is not in acute distress.    Appearance: Normal appearance. She is not ill-appearing, toxic-appearing or diaphoretic.  HENT:     Head: Normocephalic and atraumatic.     Nose: Nose normal.     Mouth/Throat:     Mouth: Mucous membranes are moist.     Pharynx: Oropharynx is clear.  Eyes:     General: No scleral icterus.    Extraocular Movements: Extraocular movements intact.  Cardiovascular:     Rate and Rhythm: Regular rhythm. Tachycardia present.     Heart sounds: Normal heart sounds. No murmur heard.   No friction rub. No gallop.  Pulmonary:     Effort: Pulmonary effort is normal. No respiratory distress.     Breath sounds: Normal breath sounds. No wheezing, rhonchi or rales.  Abdominal:     General: Abdomen is flat. Bowel sounds are normal. There is no distension.     Palpations: Abdomen is soft.     Tenderness: There is no abdominal tenderness. There is no guarding or rebound.  Musculoskeletal:     Cervical back: Neck supple.     Right lower leg: No edema.     Left lower leg: No edema.  Skin:    General: Skin is warm  and dry.     Coloration: Skin is not jaundiced or pale.  Neurological:     General: No focal deficit present.     Mental Status: She is alert and oriented to person, place, and time. Mental status is at baseline.  Psychiatric:        Mood and Affect: Mood normal.        Behavior: Behavior normal.        Thought Content: Thought content  normal.        Judgment: Judgment normal.     Assessment:  [name] is a 76 y.o. female  who presents today for Esophagogastroduodenoscopy and Colonoscopy for abnormal ct with thickened stomach; abdominal pain; polyp surveillance.  Plan:  Esophagogastroduodenoscopy and Colonoscopy with possible intervention today  Esophagogastroduodenoscopy and Colonoscopy with possible biopsy, control of bleeding, polypectomy, and interventions as necessary has been discussed with the patient/patient representative. Informed consent was obtained from the patient/patient representative after explaining the indication, nature, and risks of the procedure including but not limited to death, bleeding, perforation, missed neoplasm/lesions, cardiorespiratory compromise, and reaction to medications. Opportunity for questions was given and appropriate answers were provided. Patient/patient representative has verbalized understanding is amenable to undergoing the procedure.   [name], DO  [facility] Gastroenterology  Portions of the record may have been created with voice recognition software. Occasional wrong-word or 'sound-a-like' substitutions may have occurred due to the inherent limitations of voice recognition software.  Read the chart carefully and recognize, using context, where substitutions may have occurred.

## 2021-04-24 NOTE — Transfer of Care (Signed)
Immediate Anesthesia Transfer of Care Note  Patient: Marilyn Coleman  Procedure(s) Performed: COLONOSCOPY WITH PROPOFOL ESOPHAGOGASTRODUODENOSCOPY (EGD) WITH PROPOFOL  Patient Location: PACU  Anesthesia Type:General  Level of Consciousness: awake and sedated  Airway & Oxygen Therapy: Patient Spontanous Breathing and Patient connected to nasal cannula oxygen  Post-op Assessment: Report given to RN and Post -op Vital signs reviewed and stable  Post vital signs: Reviewed and stable  Last Vitals:  Vitals Value Taken Time  BP    Temp    Pulse    Resp    SpO2      Last Pain:  Vitals:   04/24/21 0812  TempSrc: Temporal  PainSc: 0-No pain         Complications: No notable events documented.

## 2021-04-24 NOTE — Op Note (Signed)
[facility] Gastroenterology Patient Name: Marilyn Coleman Procedure Date: 04/24/2021 8:54 AM MRN: [id number] Account #: [id number] Date of Birth: June 25, 1945 Admit Type: Outpatient Age: 76 Room: [facility] ENDO ROOM 1 Gender: Female Note Status: Finalized Instrument Name: Upper Endoscope [id number] Procedure:             Upper GI endoscopy Indications:           Abnormal CT of the GI tract Providers:             Rueben Bash, DO Referring MD:          Ramonita Lab, MD (Referring MD) Medicines:             Monitored Anesthesia Care Complications:         No immediate complications. Estimated blood loss:                         Minimal. Procedure:             Pre-Anesthesia Assessment:                        - Prior to the procedure, a History and Physical was                         performed, and patient medications and allergies were                         reviewed. The patient is competent. The risks and                         benefits of the procedure and the sedation options and                         risks were discussed with the patient. All questions                         were answered and informed consent was obtained.                         Patient identification and proposed procedure were                         verified by the physician, the nurse, the anesthetist                         and the technician in the endoscopy suite. Mental                         Status Examination: alert and oriented. Airway                         Examination: normal oropharyngeal airway and neck                         mobility. Respiratory Examination: clear to                         auscultation. CV Examination: RRR, no murmurs, no S3  or S4. Prophylactic Antibiotics: The patient does not                         require prophylactic antibiotics. Prior                         Anticoagulants: The patient has taken no previous                          anticoagulant or antiplatelet agents. ASA Grade                         Assessment: II - A patient with mild systemic disease.                         After reviewing the risks and benefits, the patient                         was deemed in satisfactory condition to undergo the                         procedure. The anesthesia plan was to use monitored                         anesthesia care (MAC). Immediately prior to                         administration of medications, the patient was                         re-assessed for adequacy to receive sedatives. The                         heart rate, respiratory rate, oxygen saturations,                         blood pressure, adequacy of pulmonary ventilation, and                         response to care were monitored throughout the                         procedure. The physical status of the patient was                         re-assessed after the procedure.                        After obtaining informed consent, the endoscope was                         passed under direct vision. Throughout the procedure,                         the patient's blood pressure, pulse, and oxygen                         saturations were monitored continuously. The Endoscope  was introduced through the mouth, and advanced to the                         second part of duodenum. The upper GI endoscopy was                         accomplished without difficulty. The patient tolerated                         the procedure well. Findings:      Localized mild inflammation characterized by erythema was found in the       duodenal bulb. Biopsies were taken with a cold forceps for histology.       Estimated blood loss was minimal.      The exam of the duodenum was otherwise normal.      Scattered moderate inflammation characterized by adherent blood and       erythema, but no active bleeding was found in the entire examined        stomach. Biopsies were taken with a cold forceps for histology.       Estimated blood loss was minimal.      The Z-line was regular. Estimated blood loss: none.      Esophagogastric landmarks were identified: the gastroesophageal junction       was found at 36 cm from the incisors.      The examined esophagus was normal. Estimated blood loss: none. Impression:            - Duodenitis. Biopsied.                        - Gastritis. Biopsied.                        - Z-line regular.                        - Esophagogastric landmarks identified.                        - Normal esophagus. Recommendation:        - Discharge patient to home.                        - Resume previous diet.                        - Continue present medications.                        - Continue proton pump inhibitor (acid suppressive                         therapy)                        - No aspirin, ibuprofen, naproxen, or other                         non-steroidal anti-inflammatory drugs.                        - Await pathology results.                        -  Return to GI clinic as previously scheduled. Procedure Code(s):     --- Professional ---                        [phone], Esophagogastroduodenoscopy, flexible,                         transoral; with biopsy, single or multiple Diagnosis Code(s):     --- Professional ---                        K29.80, Duodenitis without bleeding                        K29.70, Gastritis, unspecified, without bleeding                        R93.3, Abnormal findings on diagnostic imaging of                         other parts of digestive tract CPT copyright 2019 American Medical Association. All rights reserved. The codes documented in this report are preliminary and upon coder review may  be revised to meet current compliance requirements. Attending Participation:      I personally performed the entire procedure. Volney American, DO [name] DO, DO 04/24/2021  9:45:36 AM This report has been signed electronically. Number of Addenda: 0 Note Initiated On: 04/24/2021 8:54 AM Estimated Blood Loss:  Estimated blood loss was minimal.      [facility]

## 2021-04-24 NOTE — Anesthesia Preprocedure Evaluation (Addendum)
Anesthesia Evaluation  Patient identified by MRN, date of birth, ID band Patient awake    Reviewed: Allergy & Precautions, NPO status , Patient's Chart, lab work & pertinent test results  History of Anesthesia Complications Negative for: history of anesthetic complications  Airway Mallampati: I   Neck ROM: Full    Dental  (+)    Pulmonary Current Smoker (1 ppd) and Patient abstained from smoking.,  Granulomatous lung disease   Pulmonary exam normal breath sounds clear to auscultation       Cardiovascular Exercise Tolerance: Good negative cardio ROS Normal cardiovascular exam Rhythm:Regular Rate:Normal     Neuro/Psych negative neurological ROS     GI/Hepatic negative GI ROS,   Endo/Other  negative endocrine ROS  Renal/GU negative Renal ROS     Musculoskeletal   Abdominal   Peds  Hematology Breast CA   Anesthesia Other Findings   Reproductive/Obstetrics                            Anesthesia Physical Anesthesia Plan  ASA: 2  Anesthesia Plan: General   Post-op Pain Management:    Induction: Intravenous  PONV Risk Score and Plan: 2 and Propofol infusion, TIVA and Treatment may vary due to age or medical condition  Airway Management Planned: Natural Airway  Additional Equipment:   Intra-op Plan:   Post-operative Plan:   Informed Consent: I have reviewed the patients History and Physical, chart, labs and discussed the procedure including the risks, benefits and alternatives for the proposed anesthesia with the patient or authorized representative who has indicated his/her understanding and acceptance.       Plan Discussed with: CRNA  Anesthesia Plan Comments: (LMA/GETA backup discussed.  Patient consented for risks of anesthesia including but not limited to:  - adverse reactions to medications - damage to eyes, teeth, lips or other oral mucosa - nerve damage due to  positioning  - sore throat or hoarseness - damage to heart, brain, nerves, lungs, other parts of body or loss of life  Informed patient about role of CRNA in peri- and intra-operative care.  Patient voiced understanding.)        Anesthesia Quick Evaluation

## 2021-04-25 ENCOUNTER — Encounter: Payer: Self-pay | Admitting: Gastroenterology

## 2021-04-25 LAB — SURGICAL PATHOLOGY

## 2021-07-01 ENCOUNTER — Other Ambulatory Visit: Payer: Self-pay | Admitting: Internal Medicine

## 2021-07-01 DIAGNOSIS — Z1231 Encounter for screening mammogram for malignant neoplasm of breast: Secondary | ICD-10-CM

## 2021-08-06 ENCOUNTER — Ambulatory Visit
Admission: RE | Admit: 2021-08-06 | Discharge: 2021-08-06 | Disposition: A | Discharge status: Home / Self Care | Payer: Medicare Other | Source: Ambulatory Visit | Attending: Internal Medicine | Admitting: Internal Medicine

## 2021-08-06 DIAGNOSIS — Z1231 Encounter for screening mammogram for malignant neoplasm of breast: Secondary | ICD-10-CM | POA: Diagnosis present

## 2021-10-13 ENCOUNTER — Other Ambulatory Visit: Payer: Self-pay | Admitting: *Deleted

## 2021-10-13 DIAGNOSIS — Z87891 Personal history of nicotine dependence: Secondary | ICD-10-CM

## 2021-10-13 DIAGNOSIS — F17210 Nicotine dependence, cigarettes, uncomplicated: Secondary | ICD-10-CM

## 2021-10-13 DIAGNOSIS — Z122 Encounter for screening for malignant neoplasm of respiratory organs: Secondary | ICD-10-CM

## 2021-10-13 NOTE — Progress Notes (Signed)
ct 

## 2022-01-15 ENCOUNTER — Other Ambulatory Visit: Payer: Self-pay | Admitting: Internal Medicine

## 2022-01-15 DIAGNOSIS — I77810 Thoracic aortic ectasia: Secondary | ICD-10-CM

## 2022-01-15 DIAGNOSIS — E7849 Other hyperlipidemia: Secondary | ICD-10-CM

## 2022-02-10 ENCOUNTER — Ambulatory Visit: Admission: RE | Admit: 2022-02-10 | Payer: Medicare Other | Source: Ambulatory Visit

## 2022-02-25 ENCOUNTER — Other Ambulatory Visit: Payer: Self-pay | Admitting: Internal Medicine

## 2022-02-25 ENCOUNTER — Ambulatory Visit
Admission: RE | Admit: 2022-02-25 | Discharge: 2022-02-25 | Disposition: A | Discharge status: Home / Self Care | Payer: Medicare Other | Source: Ambulatory Visit | Attending: Internal Medicine | Admitting: Internal Medicine

## 2022-02-25 DIAGNOSIS — E7849 Other hyperlipidemia: Secondary | ICD-10-CM | POA: Insufficient documentation

## 2022-02-25 DIAGNOSIS — I77810 Thoracic aortic ectasia: Secondary | ICD-10-CM | POA: Insufficient documentation

## 2022-02-25 MED ORDER — IOHEXOL 350 MG/ML SOLN
75.0000 mL | Freq: Once | INTRAVENOUS | Status: AC | PRN
  Administered 2022-02-25: 75 mL via INTRAVENOUS

## 2022-07-09 ENCOUNTER — Other Ambulatory Visit: Payer: Self-pay | Admitting: Internal Medicine

## 2022-07-09 DIAGNOSIS — Z1231 Encounter for screening mammogram for malignant neoplasm of breast: Secondary | ICD-10-CM

## 2022-08-10 ENCOUNTER — Ambulatory Visit
Admission: RE | Admit: 2022-08-10 | Discharge: 2022-08-10 | Disposition: A | Discharge status: Home / Self Care | Payer: Medicare Other | Source: Ambulatory Visit | Attending: Internal Medicine | Admitting: Internal Medicine

## 2022-08-10 DIAGNOSIS — Z1231 Encounter for screening mammogram for malignant neoplasm of breast: Secondary | ICD-10-CM | POA: Diagnosis present

## 2023-02-11 ENCOUNTER — Other Ambulatory Visit: Payer: Self-pay | Admitting: Internal Medicine

## 2023-02-11 DIAGNOSIS — E7849 Other hyperlipidemia: Secondary | ICD-10-CM

## 2023-02-11 DIAGNOSIS — I77810 Thoracic aortic ectasia: Secondary | ICD-10-CM

## 2023-02-18 ENCOUNTER — Ambulatory Visit
Admission: RE | Admit: 2023-02-18 | Discharge: 2023-02-18 | Disposition: A | Discharge status: Home / Self Care | Payer: Medicare Other | Source: Ambulatory Visit | Attending: Internal Medicine | Admitting: Internal Medicine

## 2023-02-18 DIAGNOSIS — I77810 Thoracic aortic ectasia: Secondary | ICD-10-CM | POA: Insufficient documentation

## 2023-02-18 DIAGNOSIS — E7849 Other hyperlipidemia: Secondary | ICD-10-CM | POA: Diagnosis present

## 2023-02-18 MED ORDER — IOHEXOL 350 MG/ML SOLN
75.0000 mL | Freq: Once | INTRAVENOUS | Status: AC | PRN
  Administered 2023-02-18: 75 mL via INTRAVENOUS

## 2023-08-02 ENCOUNTER — Other Ambulatory Visit: Payer: Self-pay | Admitting: Internal Medicine

## 2023-08-02 DIAGNOSIS — Z1231 Encounter for screening mammogram for malignant neoplasm of breast: Secondary | ICD-10-CM

## 2023-08-05 ENCOUNTER — Encounter (HOSPITAL_COMMUNITY): Payer: Self-pay

## 2023-08-12 ENCOUNTER — Encounter

## 2023-09-09 ENCOUNTER — Ambulatory Visit
Admission: RE | Admit: 2023-09-09 | Discharge: 2023-09-09 | Disposition: A | Discharge status: Home / Self Care | Source: Ambulatory Visit | Attending: Internal Medicine | Admitting: Internal Medicine

## 2023-09-09 DIAGNOSIS — Z1231 Encounter for screening mammogram for malignant neoplasm of breast: Secondary | ICD-10-CM | POA: Diagnosis present

## 2024-02-10 ENCOUNTER — Ambulatory Visit: Admitting: Orthopedic Surgery

## 2024-02-16 ENCOUNTER — Other Ambulatory Visit: Payer: Self-pay | Admitting: Internal Medicine

## 2024-02-16 DIAGNOSIS — E7849 Other hyperlipidemia: Secondary | ICD-10-CM

## 2024-02-16 DIAGNOSIS — I77810 Thoracic aortic ectasia: Secondary | ICD-10-CM

## 2024-02-29 ENCOUNTER — Ambulatory Visit
Admission: RE | Admit: 2024-02-29 | Discharge: 2024-02-29 | Disposition: A | Source: Ambulatory Visit | Attending: Internal Medicine | Admitting: Internal Medicine

## 2024-02-29 DIAGNOSIS — I77810 Thoracic aortic ectasia: Secondary | ICD-10-CM | POA: Diagnosis present

## 2024-02-29 DIAGNOSIS — E7849 Other hyperlipidemia: Secondary | ICD-10-CM | POA: Insufficient documentation

## 2024-02-29 MED ORDER — IOHEXOL 350 MG/ML SOLN
75.0000 mL | Freq: Once | INTRAVENOUS | Status: AC | PRN
  Administered 2024-02-29: 75 mL via INTRAVENOUS

## 2024-03-09 ENCOUNTER — Other Ambulatory Visit: Payer: Self-pay | Admitting: Internal Medicine

## 2024-03-09 DIAGNOSIS — N1330 Unspecified hydronephrosis: Secondary | ICD-10-CM

## 2024-03-16 ENCOUNTER — Ambulatory Visit: Admission: RE | Admit: 2024-03-16 | Discharge: 2024-03-16 | Attending: Internal Medicine | Admitting: Internal Medicine

## 2024-03-16 DIAGNOSIS — N1330 Unspecified hydronephrosis: Secondary | ICD-10-CM | POA: Insufficient documentation

## 2024-03-16 MED ORDER — IOHEXOL 300 MG/ML  SOLN
65.0000 mL | Freq: Once | INTRAMUSCULAR | Status: AC | PRN
  Administered 2024-03-16: 14:00:00 65 mL via INTRAVENOUS

## 2024-04-19 ENCOUNTER — Ambulatory Visit (INDEPENDENT_AMBULATORY_CARE_PROVIDER_SITE_OTHER): Admitting: Urology

## 2024-04-19 VITALS — BP 129/78 | HR 90 | Ht 65.0 in | Wt 120.0 lb

## 2024-04-19 DIAGNOSIS — N135 Crossing vessel and stricture of ureter without hydronephrosis: Secondary | ICD-10-CM

## 2024-04-19 NOTE — Progress Notes (Signed)
 "  04/19/24 4:35 PM   Marilyn Coleman [birth year] [id number]  CC: Right hydronephrosis  HPI: 79 year old female referred for mild right hydronephrosis on recent CT scan.  She is asymptomatic with no flank pain or hematuria or UTIs.  Did have a component of some mild dilation on the right side on prior CT from 2022.  Renal function is normal with eGFR greater than 60.   PMH: Past Medical History:  Diagnosis Date   Allergy    BRCA negative 04/2018   MyRisk neg   Cancer (HCC) 2012   face x 4   Cancer (HCC) 1987   right breast. mastectomy   Detached retina    Dilation of thoracic aorta    Granulomatous lung disease (HCC)    Hx of adenomatous colonic polyps    Hyperlipidemia    Iritis 2005   Osteoporosis    Personal history of malignant neoplasm of breast 1987   right mastectomy   Personal history of tobacco use, presenting hazards to health    Personal history of tobacco use, presenting hazards to health 08/02/2015   Pulmonary nodule    Special screening for malignant neoplasms, colon    Tobacco abuse 08/08/2014    Surgical History: Past Surgical History:  Procedure Laterality Date   BREAST SURGERY Right    mastectomy   CATARACT EXTRACTION W/ INTRAOCULAR LENS IMPLANT Left    CATARACT EXTRACTION W/PHACO Right 04/15/2015   Procedure: CATARACT EXTRACTION PHACO AND INTRAOCULAR LENS PLACEMENT (IOC);  Surgeon: Steven Dingeldein, MD;  Location: [facility] ORS;  Service: Ophthalmology;  Laterality: Right;  US  01:53 AP% 26.2 CDE 54.15 fluid pack lot # [id number]   COLONOSCOPY  2010   Dr. Viktoria   COLONOSCOPY WITH PROPOFOL  N/A 04/17/2016   Procedure: COLONOSCOPY WITH PROPOFOL ;  Surgeon: Lamar ONEIDA Viktoria, MD;  Location: [facility] ENDOSCOPY;  Service: Endoscopy;  Laterality: N/A;   COLONOSCOPY WITH PROPOFOL  N/A 04/24/2021   Procedure: COLONOSCOPY WITH PROPOFOL ;  Surgeon: Onita Elspeth Sharper, DO;  Location: [facility] ENDOSCOPY;  Service: Gastroenterology;  Laterality: N/A;  Requests early AM    ESOPHAGOGASTRODUODENOSCOPY (EGD) WITH PROPOFOL  N/A 04/24/2021   Procedure: ESOPHAGOGASTRODUODENOSCOPY (EGD) WITH PROPOFOL ;  Surgeon: Onita Elspeth Sharper, DO;  Location: [facility] ENDOSCOPY;  Service: Gastroenterology;  Laterality: N/A;   EYE SURGERY     MASTECTOMY Right 1987   with chemo tx   RETINAL DETACHMENT SURGERY  10/2014   SKIN CANCER EXCISION  2010,2012, 2018   face x 4   TONSILLECTOMY  1962      Family History: Family History  Problem Relation Age of Onset   Cancer Other        breast cancer FH   Breast cancer Mother    Breast cancer Paternal Grandmother    Lung cancer Brother 66   Diabetes Neg Hx    Heart disease Neg Hx    Colon cancer Neg Hx    Ovarian cancer Neg Hx     Social History:  reports that she has been smoking cigarettes. She has a 30 pack-year smoking history. She has never used smokeless tobacco. She reports current alcohol use. She reports that she does not use drugs.  Physical Exam: BP 129/78 (BP Location: Left Arm, Patient Position: Sitting, Cuff Size: Normal)   Pulse 90   Ht 5' 5 (1.651 m)   Wt 120 lb (54.4 kg)   SpO2 97%   BMI 19.97 kg/m    Constitutional:  Alert and oriented, No acute distress. Cardiovascular: No clubbing, cyanosis, or edema.  Respiratory: Normal respiratory effort, no increased work of breathing. 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  With her other medical issues and age I think observation is extremely reasonable.  Return precautions were discussed at length.  Will follow-up in 1 year to recheck  kidney function, continues to get routine imaging with PCP for history of breast cancer, could consider additional CT at that time if any concerning findings or abnormal renal function.  RTC 1 year BMP prior  Redell Burnet, MD 04/19/2024  [facility] [address], [address] [place], [place] [address] [phone]   "

## 2025-04-19 ENCOUNTER — Ambulatory Visit: Admitting: Urology
